# Patient Record
Sex: Male | Born: 1945
Health system: Southern US, Community
[De-identification: ages and names within clinical notes are randomized; demographics above are authoritative.]

## PROBLEM LIST (undated history)

## (undated) DIAGNOSIS — M1990 Unspecified osteoarthritis, unspecified site: Secondary | ICD-10-CM

## (undated) DIAGNOSIS — I10 Essential (primary) hypertension: Secondary | ICD-10-CM

## (undated) DIAGNOSIS — Z8619 Personal history of other infectious and parasitic diseases: Secondary | ICD-10-CM

## (undated) DIAGNOSIS — E785 Hyperlipidemia, unspecified: Secondary | ICD-10-CM

## (undated) HISTORY — DX: Hyperlipidemia, unspecified: E78.5

## (undated) HISTORY — DX: Personal history of other infectious and parasitic diseases: Z86.19

## (undated) HISTORY — DX: Essential (primary) hypertension: I10

---

## 1996-10-10 DIAGNOSIS — H9319 Tinnitus, unspecified ear: Secondary | ICD-10-CM | POA: Insufficient documentation

## 2001-10-10 HISTORY — PX: EYE SURGERY: SHX253

## 2003-10-11 HISTORY — PX: HERNIA REPAIR: SHX51

## 2012-01-05 DIAGNOSIS — Z86010 Personal history of colonic polyps: Secondary | ICD-10-CM | POA: Diagnosis not present

## 2012-01-05 DIAGNOSIS — K229 Disease of esophagus, unspecified: Secondary | ICD-10-CM | POA: Diagnosis not present

## 2012-01-05 DIAGNOSIS — K219 Gastro-esophageal reflux disease without esophagitis: Secondary | ICD-10-CM | POA: Diagnosis not present

## 2012-01-10 DIAGNOSIS — D126 Benign neoplasm of colon, unspecified: Secondary | ICD-10-CM | POA: Diagnosis not present

## 2012-01-10 DIAGNOSIS — K222 Esophageal obstruction: Secondary | ICD-10-CM | POA: Diagnosis not present

## 2012-01-10 DIAGNOSIS — Z86010 Personal history of colonic polyps: Secondary | ICD-10-CM | POA: Diagnosis not present

## 2012-01-10 DIAGNOSIS — K319 Disease of stomach and duodenum, unspecified: Secondary | ICD-10-CM | POA: Diagnosis not present

## 2012-01-10 DIAGNOSIS — R1310 Dysphagia, unspecified: Secondary | ICD-10-CM | POA: Diagnosis not present

## 2012-01-10 DIAGNOSIS — R12 Heartburn: Secondary | ICD-10-CM | POA: Diagnosis not present

## 2012-01-10 DIAGNOSIS — K2990 Gastroduodenitis, unspecified, without bleeding: Secondary | ICD-10-CM | POA: Diagnosis not present

## 2012-01-10 DIAGNOSIS — K2970 Gastritis, unspecified, without bleeding: Secondary | ICD-10-CM | POA: Diagnosis not present

## 2012-01-10 LAB — HM COLONOSCOPY

## 2012-02-12 ENCOUNTER — Emergency Department: Payer: Self-pay | Admitting: Emergency Medicine

## 2012-02-12 DIAGNOSIS — F17200 Nicotine dependence, unspecified, uncomplicated: Secondary | ICD-10-CM | POA: Diagnosis not present

## 2012-02-12 DIAGNOSIS — J9819 Other pulmonary collapse: Secondary | ICD-10-CM | POA: Diagnosis not present

## 2012-02-12 DIAGNOSIS — R000 Tachycardia, unspecified: Secondary | ICD-10-CM | POA: Diagnosis not present

## 2012-02-12 DIAGNOSIS — Z79899 Other long term (current) drug therapy: Secondary | ICD-10-CM | POA: Diagnosis not present

## 2012-02-12 DIAGNOSIS — R071 Chest pain on breathing: Secondary | ICD-10-CM | POA: Diagnosis not present

## 2012-02-12 DIAGNOSIS — R079 Chest pain, unspecified: Secondary | ICD-10-CM | POA: Diagnosis not present

## 2012-02-12 LAB — CK TOTAL AND CKMB (NOT AT ARMC)
CK, Total: 47 U/L (ref 35–232)
CK-MB: <0.5 ng/mL — ABNORMAL LOW (ref 0.5–3.6)

## 2012-02-12 LAB — CBC WITH DIFFERENTIAL/PLATELET
Basophil #: 0 10*3/uL (ref 0.0–0.1)
Basophil %: 0.5 %
Eosinophil %: 2.6 %
HCT: 41.3 % (ref 40.0–52.0)
HGB: 14.3 g/dL (ref 13.0–18.0)
Lymphocyte #: 1.6 10*3/uL (ref 1.0–3.6)
Lymphocyte %: 19.4 %
MCH: 33.2 pg (ref 26.0–34.0)
MCV: 96 fL (ref 80–100)
Monocyte #: 0.7 x10 3/mm (ref 0.2–1.0)
Neutrophil #: 5.8 10*3/uL (ref 1.4–6.5)
Neutrophil %: 68.9 %
Platelet: 172 10*3/uL (ref 150–440)
WBC: 8.4 10*3/uL (ref 3.8–10.6)

## 2012-02-12 LAB — COMPREHENSIVE METABOLIC PANEL
Alkaline Phosphatase: 58 U/L (ref 50–136)
Anion Gap: 4 — ABNORMAL LOW (ref 7–16)
BUN: 15 mg/dL (ref 7–18)
Calcium, Total: 8.8 mg/dL (ref 8.5–10.1)
Creatinine: 1.08 mg/dL (ref 0.60–1.30)
EGFR (Non-African Amer.): >60
Osmolality: 269 (ref 275–301)
Potassium: 3.6 mmol/L (ref 3.5–5.1)
SGPT (ALT): 23 U/L
Sodium: 134 mmol/L — ABNORMAL LOW (ref 136–145)
Total Protein: 7.3 g/dL (ref 6.4–8.2)

## 2012-02-12 LAB — TROPONIN I: Troponin-I: <0.02 ng/mL

## 2012-02-17 DIAGNOSIS — M940 Chondrocostal junction syndrome [Tietze]: Secondary | ICD-10-CM | POA: Diagnosis not present

## 2012-10-25 DIAGNOSIS — H2510 Age-related nuclear cataract, unspecified eye: Secondary | ICD-10-CM | POA: Diagnosis not present

## 2012-10-25 DIAGNOSIS — D3130 Benign neoplasm of unspecified choroid: Secondary | ICD-10-CM | POA: Diagnosis not present

## 2012-10-25 DIAGNOSIS — H524 Presbyopia: Secondary | ICD-10-CM | POA: Diagnosis not present

## 2013-07-16 DIAGNOSIS — Z23 Encounter for immunization: Secondary | ICD-10-CM | POA: Diagnosis not present

## 2013-07-16 DIAGNOSIS — R109 Unspecified abdominal pain: Secondary | ICD-10-CM | POA: Diagnosis not present

## 2013-07-16 DIAGNOSIS — K219 Gastro-esophageal reflux disease without esophagitis: Secondary | ICD-10-CM | POA: Diagnosis not present

## 2015-02-10 DIAGNOSIS — I10 Essential (primary) hypertension: Secondary | ICD-10-CM | POA: Diagnosis not present

## 2015-02-10 DIAGNOSIS — M778 Other enthesopathies, not elsewhere classified: Secondary | ICD-10-CM | POA: Diagnosis not present

## 2015-02-18 DIAGNOSIS — M1812 Unilateral primary osteoarthritis of first carpometacarpal joint, left hand: Secondary | ICD-10-CM | POA: Diagnosis not present

## 2015-06-03 ENCOUNTER — Ambulatory Visit: Payer: Self-pay | Admitting: Gastroenterology

## 2015-06-24 DIAGNOSIS — K259 Gastric ulcer, unspecified as acute or chronic, without hemorrhage or perforation: Secondary | ICD-10-CM

## 2015-06-24 DIAGNOSIS — R1030 Lower abdominal pain, unspecified: Secondary | ICD-10-CM | POA: Insufficient documentation

## 2015-06-24 DIAGNOSIS — K219 Gastro-esophageal reflux disease without esophagitis: Secondary | ICD-10-CM | POA: Insufficient documentation

## 2015-06-24 DIAGNOSIS — E785 Hyperlipidemia, unspecified: Secondary | ICD-10-CM | POA: Insufficient documentation

## 2015-06-24 DIAGNOSIS — Z8619 Personal history of other infectious and parasitic diseases: Secondary | ICD-10-CM | POA: Insufficient documentation

## 2015-06-24 DIAGNOSIS — Z86010 Personal history of colonic polyps: Secondary | ICD-10-CM | POA: Insufficient documentation

## 2015-06-24 DIAGNOSIS — I10 Essential (primary) hypertension: Secondary | ICD-10-CM | POA: Insufficient documentation

## 2015-06-24 DIAGNOSIS — G43909 Migraine, unspecified, not intractable, without status migrainosus: Secondary | ICD-10-CM | POA: Insufficient documentation

## 2015-06-24 DIAGNOSIS — M659 Synovitis and tenosynovitis, unspecified: Secondary | ICD-10-CM | POA: Insufficient documentation

## 2015-06-24 DIAGNOSIS — M940 Chondrocostal junction syndrome [Tietze]: Secondary | ICD-10-CM | POA: Insufficient documentation

## 2015-06-24 DIAGNOSIS — Z860101 Personal history of adenomatous and serrated colon polyps: Secondary | ICD-10-CM | POA: Insufficient documentation

## 2015-06-24 HISTORY — DX: Personal history of other infectious and parasitic diseases: Z86.19

## 2015-06-24 HISTORY — DX: Migraine, unspecified, not intractable, without status migrainosus: G43.909

## 2015-06-24 HISTORY — DX: Gastric ulcer, unspecified as acute or chronic, without hemorrhage or perforation: K25.9

## 2015-06-26 ENCOUNTER — Ambulatory Visit (INDEPENDENT_AMBULATORY_CARE_PROVIDER_SITE_OTHER): Payer: Medicare Other | Admitting: Family Medicine

## 2015-06-26 ENCOUNTER — Encounter: Payer: Self-pay | Admitting: Family Medicine

## 2015-06-26 VITALS — BP 120/80 | HR 75 | Temp 98.0°F | Resp 16 | Ht 65.0 in | Wt 130.0 lb

## 2015-06-26 DIAGNOSIS — Z23 Encounter for immunization: Secondary | ICD-10-CM | POA: Diagnosis not present

## 2015-06-26 DIAGNOSIS — F17210 Nicotine dependence, cigarettes, uncomplicated: Secondary | ICD-10-CM | POA: Insufficient documentation

## 2015-06-26 DIAGNOSIS — Z0000 Encounter for general adult medical examination without abnormal findings: Secondary | ICD-10-CM

## 2015-06-26 DIAGNOSIS — Z720 Tobacco use: Secondary | ICD-10-CM | POA: Diagnosis not present

## 2015-06-26 DIAGNOSIS — I10 Essential (primary) hypertension: Secondary | ICD-10-CM | POA: Diagnosis not present

## 2015-06-26 NOTE — Progress Notes (Signed)
Patient: Brad Myers, Male    DOB: 03/17/1946, 69 y.o.   MRN: [id number] Visit Date: 06/26/2015  Today's Provider: Lelon Huh, MD   Chief Complaint  Patient presents with  . Medicare Wellness  . Hypertension  . Gastrophageal Reflux  . Hyperlipidemia   Subjective:    Annual wellness visit Brad Myers is a 69 y.o. male. He feels well. He reports exercising yes/walking. He reports he is sleeping well.  -----------------------------------------------------------  Follow-up for GERD from 07/16/2013; started omeprazole 40 mg qd.    Hypertension, follow-up:  BP Readings from Last 3 Encounters:  06/26/15 120/80  02/10/15 126/66    He was last seen for hypertension 4 months ago.  BP at that visit was 126-66. Management since that visit includes none .He reports good compliance with treatment. He is not having side effects. none  He is not exercising. He is not adherent to low salt diet.   Outside blood pressures are n/a. He is experiencing none.  Patient denies none.   Cardiovascular risk factors include none.  Use of agents associated with hypertension: none.   ----------------------------------------------------------------------    Lipid/Cholesterol, Follow-up:   Last seen for this 12/16/2011/followed by [facility]. Management since that visit includes none.  Last Lipid Panel: No results found for: CHOL, TRIG, HDL, CHOLHDL, VLDL, LDLCALC, LDLDIRECT  He reports good compliance with treatment. He is not having side effects. none  Wt Readings from Last 3 Encounters:  06/26/15 130 lb (58.968 kg)  02/10/15 134 lb (60.782 kg)    ------------------------------------------------------------------------ Continues to follow up at [facility] every 3 months. Has labs done there.   Review of Systems  Constitutional: Negative.  Negative for fever, chills, appetite change and fatigue.  HENT: Negative.  Negative for congestion, ear pain, hearing loss, nosebleeds and trouble  swallowing.   Eyes: Negative.  Negative for pain and visual disturbance.  Respiratory: Negative.  Negative for cough, chest tightness and shortness of breath.   Cardiovascular: Negative.  Negative for chest pain, palpitations and leg swelling.  Gastrointestinal: Negative.  Negative for nausea, vomiting, abdominal pain, diarrhea, constipation and blood in stool.  Endocrine: Negative.  Negative for polydipsia, polyphagia and polyuria.  Genitourinary: Negative.  Negative for dysuria and flank pain.  Musculoskeletal: Negative.  Negative for myalgias, back pain, joint swelling, arthralgias and neck stiffness.  Skin: Negative.  Negative for color change, rash and wound.  Allergic/Immunologic: Negative.   Neurological: Negative.  Negative for dizziness, tremors, seizures, speech difficulty, weakness, light-headedness and headaches.  Hematological: Negative.   Psychiatric/Behavioral: Negative.  Negative for behavioral problems, confusion, sleep disturbance, dysphoric mood and decreased concentration. The patient is not nervous/anxious.   All other systems reviewed and are negative.   Social History   Social History  . Marital Status: Divorced    Spouse Name: N/A  . Number of Children: N/A  . Years of Education: N/A   Occupational History  . Not on file.   Social History Main Topics  . Smoking status: Former Research scientist (life sciences)  . Smokeless tobacco: Never Used  . Alcohol Use: Yes     Comment: Occasional use  . Drug Use: No  . Sexual Activity: Not on file   Other Topics Concern  . Not on file   Social History Narrative    Patient Active Problem List   Diagnosis Date Noted  . Tobacco abuse 06/26/2015  . Colon polyps 06/24/2015  . Essential (primary) hypertension 06/24/2015  . GERD (gastroesophageal reflux disease) 06/24/2015  .  HLD (hyperlipidemia) 06/24/2015  . Headache, migraine 06/24/2015  . Stomach ulcer 06/24/2015  . Tenosynovitis of hand 06/24/2015    Past Surgical History    Procedure Laterality Date  . Eye surgery Right 2003  . Hernia repair Right 2005    His family history is not on file.    Previous Medications   ASPIRIN 81 MG EC TABLET    Take 1 tablet by mouth daily.   NAPROXEN PO    Take by mouth at bedtime. One oral   OMEPRAZOLE (PRILOSEC) 40 MG CAPSULE    Take 1 capsule by mouth daily.   TRAZODONE HCL PO    Take by mouth at bedtime. 1 oral. Specific dose unknown   TRIAMTERENE (DYRENIUM) 50 MG CAPSULE    Take 1 capsule by mouth daily.    Patient Care Team: [name], MD as PCP - General (Family Medicine)     Objective:   Vitals: BP 120/80 mmHg  Pulse 75  Temp(Src) 98 F (36.7 C) (Oral)  Resp 16  Ht 5\' 5"  (1.651 m)  Wt 130 lb (58.968 kg)  BMI 21.63 kg/m2  SpO2 98%  Physical Exam   General Appearance:    Alert, cooperative, no distress, appears stated age  Head:    Normocephalic, without obvious abnormality, atraumatic  Eyes:    PERRL, conjunctiva/corneas clear, EOM's intact, fundi    benign, both eyes       Ears:    Normal TM's and external ear canals, both ears  Nose:   Nares normal, septum midline, mucosa normal, no drainage   or sinus tenderness  Throat:   Lips, mucosa, and tongue normal; teeth and gums normal  Neck:   Supple, symmetrical, trachea midline, no adenopathy;       thyroid:  No enlargement/tenderness/nodules; no carotid   bruit or JVD  Back:     Symmetric, no curvature, ROM normal, no CVA tenderness  Lungs:     Clear to auscultation bilaterally, respirations unlabored  Chest wall:    No tenderness or deformity  Heart:    Regular rate and rhythm, S1 and S2 normal, no murmur, rub   or gallop  Abdomen:     Soft, non-tender, bowel sounds active all four quadrants,    no masses, no organomegaly  Genitalia:    deferred  Rectal:    deferred  Extremities:   Extremities normal, atraumatic, no cyanosis or edema  Pulses:   2+ and symmetric all extremities  Skin:   Skin color, texture, turgor normal, no rashes or  lesions  Lymph nodes:   Cervical, supraclavicular, and axillary nodes normal  Neurologic:   CNII-XII intact. Normal strength, sensation and reflexes      throughout    Activities of Daily Living In your present state of health, do you have any difficulty performing the following activities: 06/26/2015  Hearing? Y  Vision? Y  Difficulty concentrating or making decisions? N  Walking or climbing stairs? N  Dressing or bathing? N  Doing errands, shopping? N    Fall Risk Assessment Fall Risk  06/26/2015  Falls in the past year? No     Depression Screen PHQ 2/9 Scores 06/26/2015  PHQ - 2 Score 0   Audit-C Alcohol Use Screening  Question Answer Points  How often do you have alcoholic drink? 2-4 times monthly 2  How many drinks do you typically consume in a day? 3 or 4 1  How oftey will you drink 6 or more in a  total? less than once a month 1  Total Score:  4   A score of 3 or more in women, and 4 or more in men indicates increased risk for alcohol abuse, EXCEPT if all of the points are from question 1.       Assessment & Plan:     Annual Wellness Visit  Reviewed patient's Family Medical History Reviewed and updated list of patient's medical providers Assessment of cognitive impairment was done Assessed patient's functional ability Established a written schedule for health screening [place] Completed and Reviewed  Exercise Activities and Dietary recommendations Goals    None      Immunization History  Administered Date(s) Administered  . Pneumococcal Conjugate-13 07/18/2014  . Pneumococcal Polysaccharide-23 01/23/2012  . Tdap 01/23/2012  . Zoster 05/18/2012    Health Maintenance  Topic Date Due  . Hepatitis C Screening  [birth year]  . INFLUENZA VACCINE  05/11/2015  . COLONOSCOPY  01/09/2017  . TETANUS/TDAP  01/22/2022  . ZOSTAVAX  Completed  . PNA vac Low Risk Adult  Completed      Discussed health benefits of physical activity,  and encouraged him to engage in regular exercise appropriate for his age and condition.    ------------------------------------------------------------------------------------------------------------  1. Medicare annual wellness visit, subsequent Doing well.   2. Essential (primary) hypertension Labs and meds ordered through [place].  - EKG 12-Lead  3. Need for influenza vaccination  - Flu vaccine HIGH DOSE PF  4. Tobacco abuse 1/2 - 1 ppd since he was in the [employer] nearly 50 years ago.  - CT CHEST LUNG CA SCREEN LOW DOSE W/O CM; Future

## 2015-07-09 ENCOUNTER — Other Ambulatory Visit: Payer: Self-pay | Admitting: Family Medicine

## 2015-07-09 ENCOUNTER — Encounter: Payer: Self-pay | Admitting: Family Medicine

## 2015-07-09 DIAGNOSIS — Z87891 Personal history of nicotine dependence: Secondary | ICD-10-CM | POA: Insufficient documentation

## 2015-07-10 ENCOUNTER — Ambulatory Visit: Payer: Medicare Other

## 2015-07-10 ENCOUNTER — Inpatient Hospital Stay: Payer: Medicare Other | Attending: Family Medicine | Admitting: Family Medicine

## 2015-07-10 DIAGNOSIS — Z87891 Personal history of nicotine dependence: Secondary | ICD-10-CM

## 2015-07-16 ENCOUNTER — Other Ambulatory Visit: Payer: Self-pay | Admitting: Family Medicine

## 2015-07-16 DIAGNOSIS — Z87891 Personal history of nicotine dependence: Secondary | ICD-10-CM

## 2015-07-17 ENCOUNTER — Inpatient Hospital Stay: Payer: Medicare Other | Attending: Family Medicine | Admitting: Family Medicine

## 2015-07-17 ENCOUNTER — Ambulatory Visit
Admission: RE | Admit: 2015-07-17 | Discharge: 2015-07-17 | Disposition: A | Discharge status: Home / Self Care | Payer: Medicare Other | Source: Ambulatory Visit | Attending: Family Medicine | Admitting: Family Medicine

## 2015-07-17 DIAGNOSIS — I2510 Atherosclerotic heart disease of native coronary artery without angina pectoris: Secondary | ICD-10-CM | POA: Insufficient documentation

## 2015-07-17 DIAGNOSIS — F17210 Nicotine dependence, cigarettes, uncomplicated: Secondary | ICD-10-CM

## 2015-07-17 DIAGNOSIS — Z87891 Personal history of nicotine dependence: Secondary | ICD-10-CM

## 2015-07-17 DIAGNOSIS — Z122 Encounter for screening for malignant neoplasm of respiratory organs: Secondary | ICD-10-CM | POA: Diagnosis not present

## 2015-07-17 NOTE — Progress Notes (Signed)
In accordance with CMS guidelines, patient has meet eligibility criteria including age, absence of signs or symptoms of lung cancer, the specific calculation of cigarette smoking pack-years was 30 years and is a current smoker.   A shared decision-making session was conducted prior to the performance of CT scan. This includes one or more decision aids, includes benefits and harms of screening, follow-up diagnostic testing, over-diagnosis, false positive rate, and total radiation exposure.  Counseling on the importance of adherence to annual lung cancer LDCT screening, impact of co-morbidities, and ability or willingness to undergo diagnosis and treatment is imperative for compliance of the program.  Counseling on the importance of continued smoking cessation for former smokers; the importance of smoking cessation for current smokers and information about tobacco cessation interventions have been given to patient including the [place] at [facility], 1800 quit , as well as Cancer Center specific smoking cessation programs.  Written order for lung cancer screening with LDCT has been given to the patient and any and all questions have been answered to the best of my abilities.   Yearly follow up will be scheduled by Shawn Perkins, Thoracic Navigator.   

## 2015-07-20 ENCOUNTER — Telehealth: Payer: Self-pay | Admitting: *Deleted

## 2015-07-20 ENCOUNTER — Encounter: Payer: Self-pay | Admitting: Family Medicine

## 2015-07-20 DIAGNOSIS — I2510 Atherosclerotic heart disease of native coronary artery without angina pectoris: Secondary | ICD-10-CM | POA: Insufficient documentation

## 2015-07-20 NOTE — Telephone Encounter (Signed)
  Oncology Nurse Navigator Documentation    Navigator Encounter Type: Treatment;Screening (07/20/15 0900)                          Notified patient of LDCT lung cancer screening results of Lung Rads 2 finding with recommendation for 12 month follow up imaging. Also notified of incidental finding noted below. Patient verbalizes understanding.   IMPRESSION: 1. Lung-RADS Category 2, benign appearance or behavior. Continue annual screening with low-dose chest CT without contrast in 12 months. 2. Coronary artery calcification.

## 2015-08-07 NOTE — Progress Notes (Signed)
This encounter was created in error - please disregard.

## 2015-08-12 DIAGNOSIS — M1811 Unilateral primary osteoarthritis of first carpometacarpal joint, right hand: Secondary | ICD-10-CM | POA: Diagnosis not present

## 2016-03-14 DIAGNOSIS — M7541 Impingement syndrome of right shoulder: Secondary | ICD-10-CM | POA: Diagnosis not present

## 2016-03-14 DIAGNOSIS — M7502 Adhesive capsulitis of left shoulder: Secondary | ICD-10-CM | POA: Diagnosis not present

## 2016-04-25 ENCOUNTER — Other Ambulatory Visit: Payer: Self-pay | Admitting: Specialist

## 2016-04-25 DIAGNOSIS — M7542 Impingement syndrome of left shoulder: Secondary | ICD-10-CM

## 2016-05-06 ENCOUNTER — Ambulatory Visit
Admission: RE | Admit: 2016-05-06 | Discharge: 2016-05-06 | Disposition: A | Discharge status: Home / Self Care | Payer: Medicare Other | Source: Ambulatory Visit | Attending: Specialist | Admitting: Specialist

## 2016-05-06 DIAGNOSIS — M7582 Other shoulder lesions, left shoulder: Secondary | ICD-10-CM | POA: Insufficient documentation

## 2016-05-06 DIAGNOSIS — M19012 Primary osteoarthritis, left shoulder: Secondary | ICD-10-CM | POA: Diagnosis not present

## 2016-05-06 DIAGNOSIS — M7542 Impingement syndrome of left shoulder: Secondary | ICD-10-CM | POA: Diagnosis not present

## 2016-05-09 DIAGNOSIS — M7502 Adhesive capsulitis of left shoulder: Secondary | ICD-10-CM | POA: Diagnosis not present

## 2016-07-12 ENCOUNTER — Telehealth: Payer: Self-pay | Admitting: *Deleted

## 2016-07-12 DIAGNOSIS — L570 Actinic keratosis: Secondary | ICD-10-CM | POA: Diagnosis not present

## 2016-07-12 DIAGNOSIS — L821 Other seborrheic keratosis: Secondary | ICD-10-CM | POA: Diagnosis not present

## 2016-07-12 DIAGNOSIS — D225 Melanocytic nevi of trunk: Secondary | ICD-10-CM | POA: Diagnosis not present

## 2016-07-12 DIAGNOSIS — X32XXXA Exposure to sunlight, initial encounter: Secondary | ICD-10-CM | POA: Diagnosis not present

## 2016-07-12 DIAGNOSIS — D2261 Melanocytic nevi of right upper limb, including shoulder: Secondary | ICD-10-CM | POA: Diagnosis not present

## 2016-07-12 DIAGNOSIS — D2262 Melanocytic nevi of left upper limb, including shoulder: Secondary | ICD-10-CM | POA: Diagnosis not present

## 2016-07-12 NOTE — Telephone Encounter (Signed)
Left voicemail for patient notifyng them that it is time to schedule annual low dose lung cancer screening CT scan. Instructed patient to call back to verify information prior to the scan being scheduled.  

## 2016-07-13 ENCOUNTER — Telehealth: Payer: Self-pay | Admitting: *Deleted

## 2016-07-13 NOTE — Telephone Encounter (Signed)
Patient returned call. Notified patient that annual lung cancer screening low dose CT scan is due. Confirmed that patient is within the age range of 55-77, and asymptomatic, (no signs or symptoms of lung cancer). Patient denies illness that would prevent curative treatment for lung cancer if found. The patient is a current smoker, with a 30.5 pack year history. The shared decision making visit was done 07/17/15. Patient is agreeable for CT scan being scheduled.

## 2016-08-04 ENCOUNTER — Other Ambulatory Visit: Payer: Self-pay | Admitting: *Deleted

## 2016-08-04 DIAGNOSIS — Z87891 Personal history of nicotine dependence: Secondary | ICD-10-CM

## 2016-08-12 ENCOUNTER — Ambulatory Visit
Admission: RE | Admit: 2016-08-12 | Discharge: 2016-08-12 | Disposition: A | Discharge status: Home / Self Care | Payer: Medicare Other | Source: Ambulatory Visit | Attending: Oncology | Admitting: Oncology

## 2016-08-12 DIAGNOSIS — Z87891 Personal history of nicotine dependence: Secondary | ICD-10-CM | POA: Insufficient documentation

## 2016-08-12 DIAGNOSIS — I700 Atherosclerosis of aorta: Secondary | ICD-10-CM | POA: Diagnosis not present

## 2016-08-12 DIAGNOSIS — J439 Emphysema, unspecified: Secondary | ICD-10-CM | POA: Diagnosis not present

## 2016-08-12 DIAGNOSIS — I2510 Atherosclerotic heart disease of native coronary artery without angina pectoris: Secondary | ICD-10-CM | POA: Insufficient documentation

## 2016-08-17 ENCOUNTER — Telehealth: Payer: Self-pay | Admitting: *Deleted

## 2016-08-17 NOTE — Telephone Encounter (Signed)
Notified patient of LDCT lung cancer screening results with recommendation for 6 month follow up imaging. Also notified of incidental finding noted below. Patient verbalizes understanding. I will coordinate 6 month follow up imaging. Encouraged patient to discuss incidental findings with PCP, who will receive this note forwarded via Epic.   IMPRESSION: 1. Lung-Rads category 3S, probably benign findings. Short-term follow-up in 6 months is recommended with repeat low-dose chest CT without contrast (please use the following order, "CT CHEST LCS NODULE FOLLOW-UP W/O CM"). 2. The "S" modifier above refers to potentially clinically significant non lung cancer related findings. Specifically, left main and two-vessel coronary atherosclerosis. 3. Additional findings include aortic atherosclerosis and mild emphysema.

## 2016-09-29 ENCOUNTER — Telehealth: Payer: Self-pay | Admitting: Family Medicine

## 2016-10-18 ENCOUNTER — Ambulatory Visit: Payer: Medicare Other | Admitting: Family Medicine

## 2016-10-18 ENCOUNTER — Ambulatory Visit: Payer: Medicare Other

## 2016-11-29 ENCOUNTER — Ambulatory Visit (INDEPENDENT_AMBULATORY_CARE_PROVIDER_SITE_OTHER): Payer: Medicare Other

## 2016-11-29 ENCOUNTER — Ambulatory Visit (INDEPENDENT_AMBULATORY_CARE_PROVIDER_SITE_OTHER): Payer: Medicare Other | Admitting: Family Medicine

## 2016-11-29 ENCOUNTER — Encounter: Payer: Self-pay | Admitting: Family Medicine

## 2016-11-29 VITALS — BP 138/74 | HR 80 | Temp 98.2°F | Ht 65.0 in | Wt 132.0 lb

## 2016-11-29 DIAGNOSIS — K219 Gastro-esophageal reflux disease without esophagitis: Secondary | ICD-10-CM | POA: Diagnosis not present

## 2016-11-29 DIAGNOSIS — I10 Essential (primary) hypertension: Secondary | ICD-10-CM

## 2016-11-29 DIAGNOSIS — E785 Hyperlipidemia, unspecified: Secondary | ICD-10-CM

## 2016-11-29 DIAGNOSIS — Z0000 Encounter for general adult medical examination without abnormal findings: Secondary | ICD-10-CM | POA: Diagnosis not present

## 2016-11-29 DIAGNOSIS — I2510 Atherosclerotic heart disease of native coronary artery without angina pectoris: Secondary | ICD-10-CM

## 2016-11-29 NOTE — Patient Instructions (Signed)
   Call back for referral for colonoscopy before the end of the year.

## 2016-11-29 NOTE — Progress Notes (Signed)
Patient: Brad Myers Male    DOB: July 12, 1946   71 y.o.   MRN: [id number] Visit Date: 11/29/2016  Today's Provider: Lelon Huh, MD   Chief Complaint  Patient presents with  . Hyperlipidemia    follow up  . Hypertension    follow up   Subjective:    HPI  Hypertension, follow-up:  BP Readings from Last 3 Encounters:  11/29/16 138/74  06/26/15 120/80  02/10/15 126/66    He was last seen for hypertension 2 years ago.  BP at that visit was 120/80. Management since that visit includes no changes. He reports good compliance with treatment. He is not having side effects.  He is exercising. He is adherent to low salt diet.   Outside blood pressures are normal per patient. He is experiencing none.  Patient denies chest pain, chest pressure/discomfort, claudication, dyspnea, exertional chest pressure/discomfort, fatigue, irregular heart beat, lower extremity edema, near-syncope, orthopnea, palpitations, paroxysmal nocturnal dyspnea, syncope and tachypnea.   Cardiovascular risk factors include advanced age (older than 38 for men, 75 for women), dyslipidemia, hypertension and male gender.  Use of agents associated with hypertension: NSAIDS.     Weight trend: stable Wt Readings from Last 3 Encounters:  11/29/16 132 lb (59.9 kg)  08/12/16 125 lb (56.7 kg)  07/17/15 130 lb (59 kg)    Current diet: well balanced  ------------------------------------------------------------------------  Lipid/Cholesterol, Follow-up:   Last seen for this by the [place].  Marland Kitchen Last Lipid Panel: No results found for: CHOL, TRIG, HDL, CHOLHDL, VLDL, LDLCALC, LDLDIRECT  Risk factors for vascular disease include hypercholesterolemia and hypertension  He reports good compliance with treatment. He is not having side effects.  Current symptoms include none and have been stable. Weight trend: stable Prior visit with dietician: no Current diet: well balanced Current exercise: golfing,  walking  Wt Readings from Last 3 Encounters:  11/29/16 132 lb (59.9 kg)  08/12/16 125 lb (56.7 kg)  07/17/15 130 lb (59 kg)    -------------------------------------------------------------------     No Known Allergies   Current Outpatient Prescriptions:  .  aspirin 81 MG EC tablet, Take 1 tablet by mouth daily., Disp: , Rfl:  .  meloxicam (MOBIC) 15 MG tablet, Take 15 mg by mouth daily., Disp: , Rfl:  .  methocarbamol (ROBAXIN) 500 MG tablet, Take 500 mg by mouth., Disp: , Rfl:  .  Multiple Vitamin (MULTIVITAMIN) capsule, Take 1 capsule by mouth daily., Disp: , Rfl:  .  NAPROXEN PO, Take by mouth at bedtime. One oral, Disp: , Rfl:  .  omeprazole (PRILOSEC) 40 MG capsule, Take 1 capsule by mouth daily., Disp: , Rfl:  .  simvastatin (ZOCOR) 80 MG tablet, Take 80 mg by mouth daily., Disp: , Rfl:  .  TRAZODONE HCL PO, Take by mouth at bedtime. 1 oral. Specific dose unknown, Disp: , Rfl:  .  triamterene (DYRENIUM) 50 MG capsule, Take 1 capsule by mouth daily., Disp: , Rfl:  .  triamterene-hydrochlorothiazide (DYAZIDE) 37.5-25 MG capsule, Take 1 capsule by mouth daily., Disp: , Rfl:   Review of Systems  Constitutional: Negative for appetite change, chills and fever.  Respiratory: Negative for chest tightness, shortness of breath and wheezing.   Cardiovascular: Negative for chest pain and palpitations.  Gastrointestinal: Negative for abdominal pain, nausea and vomiting.    Social History  Substance Use Topics  . Smoking status: Current Every Day Smoker    Packs/day: 0.50    Years: 50.00  Types: Cigarettes  . Smokeless tobacco: Former Systems developer    Types: Snuff, Chew  . Alcohol use 14.4 oz/week    24 Cans of beer per week   Objective:    Vital Signs - Last Recorded  Most recent update: 11/29/2016 1:08 PM by Fabio Neighbors, LPN  BP  [id number] (BP Location: Right Arm)     Pulse  80     Temp  98.2 F (36.8 C) (Oral)     Ht  5\' 5"  (1.651 m)     Wt  132 lb (59.9 kg)       BMI  21.97 kg/m       Physical Exam   General Appearance:    Alert, cooperative, no distress  Eyes:    PERRL, conjunctiva/corneas clear, EOM's intact       Lungs:     Clear to auscultation bilaterally, respirations unlabored  Heart:    Regular rate and rhythm  Neurologic:   Awake, alert, oriented x 3. No apparent focal neurological           defect.           Assessment & Plan:     1. Coronary artery calcification seen on CAT scan Asymptomatic. Compliant with medication.  Continue aggressive risk factor modification.    2. Essential (primary) hypertension Well controlled.  Continue current medications.   - Renal function panel  3. Gastroesophageal reflux disease, esophagitis presence not specified Well controlled on omeprazole.   4. Hyperlipidemia, unspecified hyperlipidemia type He is tolerating simvastatin well with no adverse effects.   - Lipid panel - Hepatic function panel       Lelon Huh, MD  [facility]

## 2016-11-29 NOTE — Progress Notes (Addendum)
Subjective:   Brad Myers is a 71 y.o. male who presents for Medicare Annual/Subsequent preventive examination.  Review of Systems:  N/A   Cardiac Risk Factors include: advanced age (>51men, >24 women);hypertension;dyslipidemia;male gender;smoking/ tobacco exposure     Objective:    Vitals: BP 138/74 (BP Location: Right Arm)   Pulse 80   Temp 98.2 F (36.8 C) (Oral)   Ht 5\' 5"  (1.651 m)   Wt 132 lb (59.9 kg)   BMI 21.97 kg/m   Body mass index is 21.97 kg/m.  Tobacco History  Smoking Status  . Current Every Day Smoker  . Packs/day: 0.50  . Years: 50.00  . Types: Cigarettes  Smokeless Tobacco  . Former Systems developer  . Types: Snuff, Chew     Ready to quit: No Counseling given: No   Past Medical History:  Diagnosis Date  . History of chicken pox 06/24/2015   DID have Chicken Pox. DID have Measles. DID have Mumps.    . Hyperlipidemia   . Hypertension   . Personal history of tobacco use, presenting hazards to health 07/09/2015   Past Surgical History:  Procedure Laterality Date  . EYE SURGERY Right 2003  . HERNIA REPAIR Right 2005   History reviewed. No pertinent family history. History  Sexual Activity  . Sexual activity: Not on file    Outpatient Encounter Prescriptions as of 11/29/2016  Medication Sig  . aspirin 81 MG EC tablet Take 1 tablet by mouth daily.  . meloxicam (MOBIC) 15 MG tablet Take 15 mg by mouth daily.  . methocarbamol (ROBAXIN) 500 MG tablet Take 500 mg by mouth.  . Multiple Vitamin (MULTIVITAMIN) capsule Take 1 capsule by mouth daily.  . simvastatin (ZOCOR) 80 MG tablet Take 80 mg by mouth daily.  . TRAZODONE HCL PO Take by mouth at bedtime. 1 oral. Specific dose unknown  . triamterene-hydrochlorothiazide (DYAZIDE) 37.5-25 MG capsule Take 1 capsule by mouth daily.  Marland Kitchen NAPROXEN PO Take by mouth at bedtime. One oral  . omeprazole (PRILOSEC) 40 MG capsule Take 1 capsule by mouth daily.  Marland Kitchen triamterene (DYRENIUM) 50 MG capsule Take 1 capsule by  mouth daily.   No facility-administered encounter medications on file as of 11/29/2016.     Activities of Daily Living In your present state of health, do you have any difficulty performing the following activities: 11/29/2016  Hearing? N  Vision? N  Difficulty concentrating or making decisions? N  Walking or climbing stairs? N  Dressing or bathing? N  Doing errands, shopping? N  Preparing Food and eating ? N  Using the Toilet? N  In the past six months, have you accidently leaked urine? N  Do you have problems with loss of bowel control? N  Managing your Medications? N  Managing your Finances? N  Housekeeping or managing your Housekeeping? N  Some recent data might be hidden    Patient Care Team: [name], MD as PCP - General (Family Medicine) Katherine Mantle, OD as Consulting Physician (Optometry)   Assessment:     Exercise Activities and Dietary recommendations Current Exercise Habits: Structured exercise class, Type of exercise: walking, Time (Minutes): 30, Frequency (Times/Week): 1, Weekly Exercise (Minutes/Week): 30, Intensity: Mild  Goals    . Exercise          Starting in Spring 2018, I will increase walking to at least 3 days a week for 30 minutes. I will also increase my exercise once joining the hiking exercise for seniors at [facility].      Fall Risk Fall Risk  11/29/2016 11/29/2016 06/26/2015  Falls in the past year? No No No   Depression Screen PHQ 2/9 Scores 11/29/2016 11/29/2016 06/26/2015  PHQ - 2 Score 0 0 0    Cognitive Function     6CIT Screen 11/29/2016  What Year? 0 points  What month? 0 points  What time? 0 points  Count back from 20 0 points  Months in reverse 0 points  Repeat phrase 0 points  Total Score 0    Immunization History  Administered Date(s) Administered  . Influenza, High Dose Seasonal PF 06/26/2015  . Pneumococcal Conjugate-13 07/18/2014  . Pneumococcal Polysaccharide-23 01/23/2012  . Tdap 01/23/2012  .  Zoster 05/18/2012   Screening Tests Health Maintenance  Topic Date Due  . COLONOSCOPY  01/09/2017  . TETANUS/TDAP  01/22/2022  . INFLUENZA VACCINE  Completed  . Hepatitis C Screening  Completed  . PNA vac Low Risk Adult  Completed      Plan:  I have personally reviewed and addressed the Medicare Annual Wellness questionnaire and have noted the following in the patient's chart:  A. Medical and social history B. Use of alcohol, tobacco or illicit drugs  C. Current medications and supplements D. Functional ability and status E.  Nutritional status F.  Physical activity G. Advance directives H. List of other physicians I.  Hospitalizations, surgeries, and ER visits in previous 12 months J.  [place] such as hearing and vision if needed, cognitive and depression L. Referrals and appointments - none  In addition, I have reviewed and discussed with patient certain preventive protocols, quality metrics, and best practice recommendations. A written personalized care plan for preventive services as well as general preventive health recommendations were provided to patient.  See attached scanned questionnaire for additional information.   Signed,  Fabio Neighbors, LPN Nurse Health Advisor   MD Recommendations: None.  I have reviewed the health advisor's note, was available for consultation, and agree with documentation and plan  Lelon Huh, MD

## 2016-11-29 NOTE — Patient Instructions (Signed)

## 2016-12-01 DIAGNOSIS — E785 Hyperlipidemia, unspecified: Secondary | ICD-10-CM | POA: Diagnosis not present

## 2016-12-01 DIAGNOSIS — I10 Essential (primary) hypertension: Secondary | ICD-10-CM | POA: Diagnosis not present

## 2016-12-02 ENCOUNTER — Telehealth: Payer: Self-pay

## 2016-12-02 LAB — LIPID PANEL
CHOL/HDL RATIO: 3.4 (ref 0.0–5.0)
Cholesterol, Total: 185 mg/dL (ref 100–199)
HDL: 54 mg/dL (ref >39)
LDL CALC: 85 (ref 0–99)
TRIGLYCERIDES: 228 mg/dL — AB (ref 0–149)
VLDL Cholesterol Cal: 46 — ABNORMAL HIGH (ref 5–40)

## 2016-12-02 LAB — RENAL FUNCTION PANEL
Albumin: 4.7 g/dL (ref 3.5–4.8)
BUN/Creatinine Ratio: 11 (ref 10–24)
BUN: 12 mg/dL (ref 8–27)
CO2: 23 mmol/L (ref 18–29)
CREATININE: 1.08 mg/dL (ref 0.76–1.27)
Calcium: 9.5 mg/dL (ref 8.6–10.2)
Chloride: 97 mmol/L (ref 96–106)
GFR, EST AFRICAN AMERICAN: 80 (ref >59)
GFR, EST NON AFRICAN AMERICAN: 69 (ref >59)
Glucose: 124 mg/dL — ABNORMAL HIGH (ref 65–99)
PHOSPHORUS: 3.6 mg/dL (ref 2.5–4.5)
POTASSIUM: 3.9 mmol/L (ref 3.5–5.2)
SODIUM: 139 mmol/L (ref 134–144)

## 2016-12-02 LAB — HEPATIC FUNCTION PANEL
ALK PHOS: 64 IU/L (ref 39–117)
ALT: 20 IU/L (ref 0–44)
AST: 19 IU/L (ref 0–40)
BILIRUBIN, DIRECT: 0.13 mg/dL (ref 0.00–0.40)
Bilirubin Total: 0.5 mg/dL (ref 0.0–1.2)
TOTAL PROTEIN: 7.2 g/dL (ref 6.0–8.5)

## 2016-12-02 NOTE — Telephone Encounter (Signed)
-----   Message from [name], MD sent at 12/02/2016  7:56 AM EST ----- LDL cholesterol needs to be under 70. Recommend change from simvastatin to atorvastatin 40mg  once a day, #30, rf x 3 and follow up in 3 months.

## 2016-12-02 NOTE — Telephone Encounter (Signed)
Left message to call back.      Notes Recorded by [name], MD on 12/02/2016 at 7:53 AM EST Blood sugar slightly high, but not in diabetic range. Avoid sweets and starchy foods. Cholesterol Liver and kidney functions are good. Continue current medications. Follow up September for yearly physical

## 2016-12-06 MED ORDER — ATORVASTATIN CALCIUM 40 MG PO TABS: 40.0000 mg | ORAL_TABLET | Freq: Every day | ORAL | 3 refills | Status: DC

## 2016-12-06 NOTE — Telephone Encounter (Signed)
Advised patient as below. Medication was sent into the pharmacy. Patient will call for an appt.

## 2017-01-10 DIAGNOSIS — L821 Other seborrheic keratosis: Secondary | ICD-10-CM | POA: Diagnosis not present

## 2017-01-10 DIAGNOSIS — X32XXXA Exposure to sunlight, initial encounter: Secondary | ICD-10-CM | POA: Diagnosis not present

## 2017-01-10 DIAGNOSIS — L218 Other seborrheic dermatitis: Secondary | ICD-10-CM | POA: Diagnosis not present

## 2017-01-10 DIAGNOSIS — L820 Inflamed seborrheic keratosis: Secondary | ICD-10-CM | POA: Diagnosis not present

## 2017-01-10 DIAGNOSIS — L530 Toxic erythema: Secondary | ICD-10-CM | POA: Diagnosis not present

## 2017-01-10 DIAGNOSIS — L298 Other pruritus: Secondary | ICD-10-CM | POA: Diagnosis not present

## 2017-01-10 DIAGNOSIS — L570 Actinic keratosis: Secondary | ICD-10-CM | POA: Diagnosis not present

## 2017-02-06 ENCOUNTER — Telehealth: Payer: Self-pay | Admitting: *Deleted

## 2017-02-06 ENCOUNTER — Telehealth: Payer: Self-pay

## 2017-02-06 DIAGNOSIS — Z87891 Personal history of nicotine dependence: Secondary | ICD-10-CM

## 2017-02-06 DIAGNOSIS — R9389 Abnormal findings on diagnostic imaging of other specified body structures: Secondary | ICD-10-CM

## 2017-02-06 NOTE — Telephone Encounter (Signed)
CT of Chest On 5/9 8:30 arrive @ [facility] if need to r/s

## 2017-02-06 NOTE — Telephone Encounter (Signed)
Notified patient that lung cancer screening low dose CT scan follow up imaging is due. Confirmed that patient is within the age range of 55-77, and asymptomatic, (no signs or symptoms of lung cancer). Patient denies illness that would prevent curative treatment for lung cancer if found. The patient is a current smoker, with a 31 pack year history. The shared decision making visit was done 07/17/15. Patient is agreeable for CT scan being scheduled.

## 2017-02-06 NOTE — Telephone Encounter (Signed)
Left voicemail for patient notifyng them that it is time to schedule annual low dose lung cancer screening CT scan. Instructed patient to call back to verify information prior to the scan being scheduled.  

## 2017-02-15 ENCOUNTER — Ambulatory Visit
Admission: RE | Admit: 2017-02-15 | Discharge: 2017-02-15 | Disposition: A | Discharge status: Home / Self Care | Payer: Medicare Other | Source: Ambulatory Visit | Attending: Oncology | Admitting: Oncology

## 2017-02-15 DIAGNOSIS — F17210 Nicotine dependence, cigarettes, uncomplicated: Secondary | ICD-10-CM | POA: Diagnosis not present

## 2017-02-15 DIAGNOSIS — I700 Atherosclerosis of aorta: Secondary | ICD-10-CM | POA: Diagnosis not present

## 2017-02-15 DIAGNOSIS — R938 Abnormal findings on diagnostic imaging of other specified body structures: Secondary | ICD-10-CM | POA: Diagnosis not present

## 2017-02-15 DIAGNOSIS — J438 Other emphysema: Secondary | ICD-10-CM | POA: Diagnosis not present

## 2017-02-15 DIAGNOSIS — I2510 Atherosclerotic heart disease of native coronary artery without angina pectoris: Secondary | ICD-10-CM | POA: Insufficient documentation

## 2017-02-15 DIAGNOSIS — R9389 Abnormal findings on diagnostic imaging of other specified body structures: Secondary | ICD-10-CM

## 2017-02-15 DIAGNOSIS — J439 Emphysema, unspecified: Secondary | ICD-10-CM | POA: Insufficient documentation

## 2017-02-15 DIAGNOSIS — Z87891 Personal history of nicotine dependence: Secondary | ICD-10-CM | POA: Diagnosis not present

## 2017-02-21 ENCOUNTER — Encounter: Payer: Self-pay | Admitting: *Deleted

## 2017-04-19 ENCOUNTER — Telehealth: Payer: Self-pay | Admitting: Family Medicine

## 2017-04-19 NOTE — Telephone Encounter (Signed)
Made appt

## 2017-04-21 ENCOUNTER — Encounter: Payer: Self-pay | Admitting: Family Medicine

## 2017-04-21 ENCOUNTER — Ambulatory Visit (INDEPENDENT_AMBULATORY_CARE_PROVIDER_SITE_OTHER): Payer: Medicare Other | Admitting: Family Medicine

## 2017-04-21 VITALS — BP 130/70 | HR 64 | Temp 97.7°F | Resp 16 | Wt 128.0 lb

## 2017-04-21 DIAGNOSIS — E785 Hyperlipidemia, unspecified: Secondary | ICD-10-CM | POA: Diagnosis not present

## 2017-04-21 DIAGNOSIS — I10 Essential (primary) hypertension: Secondary | ICD-10-CM

## 2017-04-21 DIAGNOSIS — I2510 Atherosclerotic heart disease of native coronary artery without angina pectoris: Secondary | ICD-10-CM

## 2017-04-21 DIAGNOSIS — G4700 Insomnia, unspecified: Secondary | ICD-10-CM

## 2017-04-21 DIAGNOSIS — R739 Hyperglycemia, unspecified: Secondary | ICD-10-CM | POA: Diagnosis not present

## 2017-04-21 NOTE — Progress Notes (Signed)
Patient: Brad Myers Male    DOB: 09-25-1946   71 y.o.   MRN: [id number] Visit Date: 04/21/2017  Today's Provider: Lelon Huh, MD   Chief Complaint  Patient presents with  . Hypertension    follow up  . Hyperlipidemia    follow up  . Insomnia    follow up   Subjective:    HPI  Hypertension, follow-up:  BP Readings from Last 3 Encounters:  11/29/16 138/74  06/26/15 120/80  02/10/15 126/66    He was last seen for hypertension 5 months ago.  BP at that visit was 138/74. Management since that visit includes no changes. He reports good compliance with treatment. He is not having side effects.  He is exercising. He is adherent to low salt diet.   Outside blood pressures are 140/80. He is experiencing none.  Patient denies chest pain, chest pressure/discomfort, claudication, dyspnea, exertional chest pressure/discomfort, fatigue, irregular heart beat, lower extremity edema, near-syncope, orthopnea, palpitations, paroxysmal nocturnal dyspnea, syncope and tachypnea.   Cardiovascular risk factors include advanced age (older than 74 for men, 23 for women), hypertension and male gender.  Use of agents associated with hypertension: NSAIDS.     Weight trend: stable Wt Readings from Last 3 Encounters:  11/29/16 132 lb (59.9 kg)  08/12/16 125 lb (56.7 kg)  07/17/15 130 lb (59 kg)    Current diet: in general, a "healthy" diet    ------------------------------------------------------------------------  Lipid/Cholesterol, Follow-up:   Last seen for this5 months ago.  Management changes since that visit include changing from Simvastatin to Atorvastatin. . Last Lipid Panel:    Component Value Date/Time   CHOL 185 12/01/2016 1019   TRIG 228 (H) 12/01/2016 1019   HDL 54 12/01/2016 1019   CHOLHDL 3.4 12/01/2016 1019   LDLCALC 85 12/01/2016 1019    Risk factors for vascular disease include hypercholesterolemia and hypertension  He reports good compliance with  treatment. He is not having side effects.  Current symptoms include none and have been stable. Weight trend: stable Prior visit with dietician: no Current diet: in general, a "healthy" diet   Current exercise: walking  Wt Readings from Last 3 Encounters:  11/29/16 132 lb (59.9 kg)  08/12/16 125 lb (56.7 kg)  07/17/15 130 lb (59 kg)    ------------------------------------------------------------------- Follow up of Insomnia:  Patient current\ly takes Trazodone 50. He reports good compliance with treatment, good tolerance and good symptom control.   Hyperglycemia. He was noted to have elevated fasting glucose of 124 with labs  In February. He does have family history of diabetes.     No Known Allergies   Current Outpatient Prescriptions:  .  aspirin 81 MG EC tablet, Take 1 tablet by mouth daily., Disp: , Rfl:  .  atorvastatin (LIPITOR) 40 MG tablet, Take 1 tablet (40 mg total) by mouth daily., Disp: 30 tablet, Rfl: 3 .  Multiple Vitamin (MULTIVITAMIN) capsule, Take 1 capsule by mouth daily., Disp: , Rfl:  .  NAPROXEN PO, Take by mouth at bedtime. One oral, Disp: , Rfl:  .  TRAZODONE HCL PO, Take 50 mg by mouth at bedtime. 1 oral. Specific dose unknown, Disp: , Rfl:  .  triamterene-hydrochlorothiazide (DYAZIDE) 37.5-25 MG capsule, Take 1 capsule by mouth daily., Disp: , Rfl:   Review of Systems  Constitutional: Negative for appetite change, chills and fever.  Respiratory: Negative for chest tightness, shortness of breath and wheezing.   Cardiovascular: Negative for chest pain and palpitations.  Gastrointestinal: Negative for abdominal pain, nausea and vomiting.    Social History  Substance Use Topics  . Smoking status: Current Every Day Smoker    Packs/day: 0.25    Years: 55.00    Types: Cigarettes  . Smokeless tobacco: Former Systems developer    Types: Snuff, Chew  . Alcohol use 14.4 oz/week    24 Cans of beer per week   Objective:   BP 130/70 (BP Location: Left Arm, Patient  Position: Sitting, Cuff Size: Normal)   Pulse 64   Temp 97.7 F (36.5 C) (Oral)   Resp 16   Wt 128 lb (58.1 kg)   SpO2 99%   BMI 21.30 kg/m     Physical Exam   General Appearance:    Alert, cooperative, no distress  Eyes:    PERRL, conjunctiva/corneas clear, EOM's intact       Lungs:     Clear to auscultation bilaterally, respirations unlabored  Heart:    Regular rate and rhythm  Neurologic:   Awake, alert, oriented x 3. No apparent focal neurological           defect.           Assessment & Plan:     1. Insomnia, unspecified type Doing well with trazodone 50mg  which he has been taking for many years prescribed from [place]. He anticipates starting to get his medications refilled from here.   2. Essential (primary) hypertension Well controlled. Continue current medications.   - Comprehensive metabolic panel  3. Hyperlipidemia, unspecified hyperlipidemia type He is tolerating change to atorvastatin well with no adverse effects.   - Lipid panel  4. Hyperglycemia  - Hemoglobin A1c       Lelon Huh, MD  [facility]

## 2017-04-22 LAB — COMPREHENSIVE METABOLIC PANEL
ALBUMIN: 4.4 g/dL (ref 3.5–4.8)
ALK PHOS: 71 IU/L (ref 39–117)
ALT: 16 IU/L (ref 0–44)
AST: 15 IU/L (ref 0–40)
Albumin/Globulin Ratio: 1.9 (ref 1.2–2.2)
BUN / CREAT RATIO: 10 (ref 10–24)
BUN: 9 mg/dL (ref 8–27)
Bilirubin Total: 0.4 mg/dL (ref 0.0–1.2)
CO2: 24 mmol/L (ref 20–29)
CREATININE: 0.9 mg/dL (ref 0.76–1.27)
Calcium: 9.4 mg/dL (ref 8.6–10.2)
Chloride: 103 mmol/L (ref 96–106)
GFR calc non Af Amer: 86 mL/min/{1.73_m2} (ref >59)
GFR, EST AFRICAN AMERICAN: 100 mL/min/{1.73_m2} (ref >59)
GLUCOSE: 93 mg/dL (ref 65–99)
Globulin, Total: 2.3 g/dL (ref 1.5–4.5)
Potassium: 4.3 mmol/L (ref 3.5–5.2)
Sodium: 140 mmol/L (ref 134–144)
TOTAL PROTEIN: 6.7 g/dL (ref 6.0–8.5)

## 2017-04-22 LAB — LIPID PANEL
CHOLESTEROL TOTAL: 138 mg/dL (ref 100–199)
Chol/HDL Ratio: 2.8 ratio (ref 0.0–5.0)
HDL: 50 mg/dL (ref >39)
LDL Calculated: 58 mg/dL (ref 0–99)
TRIGLYCERIDES: 148 mg/dL (ref 0–149)
VLDL Cholesterol Cal: 30 mg/dL (ref 5–40)

## 2017-04-22 LAB — HEMOGLOBIN A1C
Est. average glucose Bld gHb Est-mCnc: 100 mg/dL
Hgb A1c MFr Bld: 5.1 % (ref 4.8–5.6)

## 2017-12-11 ENCOUNTER — Ambulatory Visit (INDEPENDENT_AMBULATORY_CARE_PROVIDER_SITE_OTHER): Payer: Medicare Other

## 2017-12-11 VITALS — BP 124/82 | HR 72 | Temp 97.9°F | Ht 65.0 in | Wt 136.2 lb

## 2017-12-11 DIAGNOSIS — Z0000 Encounter for general adult medical examination without abnormal findings: Secondary | ICD-10-CM | POA: Diagnosis not present

## 2017-12-11 NOTE — Patient Instructions (Signed)
Brad Myers , Thank you for taking time to come for your Medicare Wellness Visit. I appreciate your ongoing commitment to your health goals. Please review the following plan we discussed and let me know if I can assist you in the future.   Screening recommendations/referrals: Colonoscopy: Pt declines today.  Recommended yearly ophthalmology/optometry visit for glaucoma screening and checkup Recommended yearly dental visit for hygiene and checkup  Vaccinations: Influenza vaccine: Up to date Pneumococcal vaccine: Up to date Tdap vaccine: Up to date Shingles vaccine: Pt declines today.     Advanced directives: Already on file.  Conditions/risks identified: Smoking cessation; recommend increasing water intake to 2-4 glasses a day (currently drinking none).  Next appointment: Declined set up of CPE, scheduled for AWV 12/13/18.  Preventive Care 72 Years and Older, Male Preventive care refers to lifestyle choices and visits with your health care provider that can promote health and wellness. What does preventive care include?  A yearly physical exam. This is also called an annual well check.  Dental exams once or twice a year.  Routine eye exams. Ask your health care provider how often you should have your eyes checked.  Personal lifestyle choices, including:  Daily care of your teeth and gums.  Regular physical activity.  Eating a healthy diet.  Avoiding tobacco and drug use.  Limiting alcohol use.  Practicing safe sex.  Taking low doses of aspirin every day.  Taking vitamin and mineral supplements as recommended by your health care provider. What happens during an annual well check? The services and screenings done by your health care provider during your annual well check will depend on your age, overall health, lifestyle risk factors, and family history of disease. Counseling  Your health care provider may ask you questions about your:  Alcohol use.  Tobacco use.  Drug  use.  Emotional well-being.  Home and relationship well-being.  Sexual activity.  Eating habits.  History of falls.  Memory and ability to understand (cognition).  Work and work Statistician. Screening  You may have the following tests or measurements:  Height, weight, and BMI.  Blood pressure.  Lipid and cholesterol levels. These may be checked every 5 years, or more frequently if you are over 83 years old.  Skin check.  Lung cancer screening. You may have this screening every year starting at age 6 if you have a 30-pack-year history of smoking and currently smoke or have quit within the past 15 years.  Fecal occult blood test (FOBT) of the stool. You may have this test every year starting at age 53.  Flexible sigmoidoscopy or colonoscopy. You may have a sigmoidoscopy every 5 years or a colonoscopy every 10 years starting at age 83.  Prostate cancer screening. Recommendations will vary depending on your family history and other risks.  Hepatitis C blood test.  Hepatitis B blood test.  Sexually transmitted disease (STD) testing.  Diabetes screening. This is done by checking your blood sugar (glucose) after you have not eaten for a while (fasting). You may have this done every 1-3 years.  Abdominal aortic aneurysm (AAA) screening. You may need this if you are a current or former smoker.  Osteoporosis. You may be screened starting at age 30 if you are at high risk. Talk with your health care provider about your test results, treatment options, and if necessary, the need for more tests. Vaccines  Your health care provider may recommend certain vaccines, such as:  Influenza vaccine. This is recommended every year.  Tetanus, diphtheria, and acellular pertussis (Tdap, Td) vaccine. You may need a Td booster every 10 years.  Zoster vaccine. You may need this after age 11.  Pneumococcal 13-valent conjugate (PCV13) vaccine. One dose is recommended after age  6.  Pneumococcal polysaccharide (PPSV23) vaccine. One dose is recommended after age 69. Talk to your health care provider about which screenings and vaccines you need and how often you need them. This information is not intended to replace advice given to you by your health care provider. Make sure you discuss any questions you have with your health care provider. Document Released: 10/23/2015 Document Revised: 06/15/2016 Document Reviewed: 07/28/2015 Elsevier Interactive Patient Education  2017 [place] Prevention in the Home Falls can cause injuries. They can happen to people of all ages. There are many things you can do to make your home safe and to help prevent falls. What can I do on the outside of my home?  Regularly fix the edges of walkways and driveways and fix any cracks.  Remove anything that might make you trip as you walk through a door, such as a raised step or threshold.  Trim any bushes or trees on the path to your home.  Use bright outdoor lighting.  Clear any walking paths of anything that might make someone trip, such as rocks or tools.  Regularly check to see if handrails are loose or broken. Make sure that both sides of any steps have handrails.  Any raised decks and porches should have guardrails on the edges.  Have any leaves, snow, or ice cleared regularly.  Use sand or salt on walking paths during winter.  Clean up any spills in your garage right away. This includes oil or grease spills. What can I do in the bathroom?  Use night lights.  Install grab bars by the toilet and in the tub and shower. Do not use towel bars as grab bars.  Use non-skid mats or decals in the tub or shower.  If you need to sit down in the shower, use a plastic, non-slip stool.  Keep the floor dry. Clean up any water that spills on the floor as soon as it happens.  Remove soap buildup in the tub or shower regularly.  Attach bath mats securely with double-sided  non-slip rug tape.  Do not have throw rugs and other things on the floor that can make you trip. What can I do in the bedroom?  Use night lights.  Make sure that you have a light by your bed that is easy to reach.  Do not use any sheets or blankets that are too big for your bed. They should not hang down onto the floor.  Have a firm chair that has side arms. You can use this for support while you get dressed.  Do not have throw rugs and other things on the floor that can make you trip. What can I do in the kitchen?  Clean up any spills right away.  Avoid walking on wet floors.  Keep items that you use a lot in easy-to-reach places.  If you need to reach something above you, use a strong step stool that has a grab bar.  Keep electrical cords out of the way.  Do not use floor polish or wax that makes floors slippery. If you must use wax, use non-skid floor wax.  Do not have throw rugs and other things on the floor that can make you trip. What can I do  with my stairs?  Do not leave any items on the stairs.  Make sure that there are handrails on both sides of the stairs and use them. Fix handrails that are broken or loose. Make sure that handrails are as long as the stairways.  Check any carpeting to make sure that it is firmly attached to the stairs. Fix any carpet that is loose or worn.  Avoid having throw rugs at the top or bottom of the stairs. If you do have throw rugs, attach them to the floor with carpet tape.  Make sure that you have a light switch at the top of the stairs and the bottom of the stairs. If you do not have them, ask someone to add them for you. What else can I do to help prevent falls?  Wear shoes that:  Do not have high heels.  Have rubber bottoms.  Are comfortable and fit you well.  Are closed at the toe. Do not wear sandals.  If you use a stepladder:  Make sure that it is fully opened. Do not climb a closed stepladder.  Make sure that both  sides of the stepladder are locked into place.  Ask someone to hold it for you, if possible.  Clearly mark and make sure that you can see:  Any grab bars or handrails.  First and last steps.  Where the edge of each step is.  Use tools that help you move around (mobility aids) if they are needed. These include:  Canes.  Walkers.  Scooters.  Crutches.  Turn on the lights when you go into a dark area. Replace any light bulbs as soon as they burn out.  Set up your furniture so you have a clear path. Avoid moving your furniture around.  If any of your floors are uneven, fix them.  If there are any pets around you, be aware of where they are.  Review your medicines with your doctor. Some medicines can make you feel dizzy. This can increase your chance of falling. Ask your doctor what other things that you can do to help prevent falls. This information is not intended to replace advice given to you by your health care provider. Make sure you discuss any questions you have with your health care provider. Document Released: 07/23/2009 Document Revised: 03/03/2016 Document Reviewed: 10/31/2014 Elsevier Interactive Patient Education  2017 [employer].

## 2017-12-11 NOTE — Progress Notes (Signed)
Subjective:   Brad Myers is a 72 y.o. male who presents for Medicare Annual/Subsequent preventive examination.  Review of Systems:  N/A  Cardiac Risk Factors include: advanced age (>34men, >75 women);smoking/ tobacco exposure;sedentary lifestyle;dyslipidemia;hypertension;male gender     Objective:    Vitals: BP 124/82 (BP Location: Left Arm)   Pulse 72   Temp 97.9 F (36.6 C) (Oral)   Ht 5\' 5"  (1.651 m)   Wt 136 lb 3.2 oz (61.8 kg)   BMI 22.66 kg/m   Body mass index is 22.66 kg/m.  Advanced Directives 12/11/2017 11/29/2016  Does Patient Have a Medical Advance Directive? Yes Yes  Type of Paramedic of [place];Living will [place];Living will  Copy of [place] in Chart? Yes No - copy requested    Tobacco Social History   Tobacco Use  Smoking Status Current Every Day Smoker  . Packs/day: 0.25  . Years: 55.00  . Pack years: 13.75  . Types: Cigarettes  Smokeless Tobacco Current User  . Types: Chew     Ready to quit: No Counseling given: No   Clinical Intake:  Pre-visit preparation completed: Yes  Pain : No/denies pain Pain Score: 0-No pain     Nutritional Status: BMI of 19-24  Normal Nutritional Risks: None Diabetes: No  How often do you need to have someone help you when you read instructions, pamphlets, or other written materials from your doctor or pharmacy?: 1 - Never  Interpreter Needed?: No  Information entered by :: [employer], LPN  Past Medical History:  Diagnosis Date  . History of chicken pox 06/24/2015   DID have Chicken Pox. DID have Measles. DID have Mumps.    . Hyperlipidemia   . Hypertension   . Personal history of tobacco use, presenting hazards to health 07/09/2015   Past Surgical History:  Procedure Laterality Date  . EYE SURGERY Right 2003  . HERNIA REPAIR Right 2005   Family History  Problem Relation Age of Onset  . Diabetes Father   . Coronary artery disease  Father    Social History   Socioeconomic History  . Marital status: Divorced    Spouse name: None  . Number of children: 1  . Years of education: None  . Highest education level: Some college, no degree  Social Needs  . Financial resource strain: Not hard at all  . Food insecurity - worry: Never true  . Food insecurity - inability: Never true  . Transportation needs - medical: No  . Transportation needs - non-medical: No  Occupational History  . Occupation: retired  Tobacco Use  . Smoking status: Current Every Day Smoker    Packs/day: 0.25    Years: 55.00    Pack years: 13.75    Types: Cigarettes  . Smokeless tobacco: Current User    Types: Chew  Substance and Sexual Activity  . Alcohol use: Yes    Alcohol/week: 7.2 oz    Types: 12 Cans of beer per week  . Drug use: No  . Sexual activity: None  Other Topics Concern  . None  Social History Narrative  . None    Outpatient Encounter Medications as of 12/11/2017  Medication Sig  . aspirin 81 MG EC tablet Take 1 tablet by mouth daily.  Marland Kitchen atorvastatin (LIPITOR) 40 MG tablet Take 1 tablet (40 mg total) by mouth daily.  . Cholecalciferol (VITAMIN D3) 1000 units CAPS Take by mouth daily.  Marland Kitchen NAPROXEN PO Take by mouth  at bedtime. One oral  . TRAZODONE HCL PO Take 50 mg by mouth at bedtime. 1 oral. Specific dose unknown  . triamterene-hydrochlorothiazide (DYAZIDE) 37.5-25 MG capsule Take 1 capsule by mouth daily.  . [DISCONTINUED] Multiple Vitamin (MULTIVITAMIN) capsule Take 1 capsule by mouth daily.   No facility-administered encounter medications on file as of 12/11/2017.     Activities of Daily Living In your present state of health, do you have any difficulty performing the following activities: 12/11/2017  Hearing? N  Vision? N  Difficulty concentrating or making decisions? N  Walking or climbing stairs? N  Dressing or bathing? N  Doing errands, shopping? N  Preparing Food and eating ? N  Using the Toilet? N  In the  past six months, have you accidently leaked urine? N  Do you have problems with loss of bowel control? N  Managing your Medications? N  Managing your Finances? N  Housekeeping or managing your Housekeeping? N  Some recent data might be hidden    Patient Care Team: [name], MD as PCP - General (Family Medicine)   Assessment:   This is a routine wellness examination for Brad Myers.  Exercise Activities and Dietary recommendations Current Exercise Habits: The patient does not participate in regular exercise at present, Exercise limited by: None identified  Goals    . DIET - INCREASE WATER INTAKE     Recommend increasing water intake to 2-4 glasses a day (currently drinking none).       Fall Risk Fall Risk  12/11/2017 11/29/2016 11/29/2016 06/26/2015  Falls in the past year? No No No No   Is the patient's home free of loose throw rugs in walkways, pet beds, electrical cords, etc?   yes      Grab bars in the bathroom? no      Handrails on the stairs?   no      Adequate lighting?   yes  Timed Get Up and Go Performed: N/A  Depression Screen PHQ 2/9 Scores 12/11/2017 11/29/2016 11/29/2016 06/26/2015  PHQ - 2 Score 2 0 0 0  PHQ- 9 Score 3 - - -    Cognitive Function: Pt declined screening today.     6CIT Screen 11/29/2016  What Year? 0 points  What month? 0 points  What time? 0 points  Count back from 20 0 points  Months in reverse 0 points  Repeat phrase 0 points  Total Score 0    Immunization History  Administered Date(s) Administered  . Influenza, High Dose Seasonal PF 06/26/2015  . Pneumococcal Conjugate-13 07/18/2014  . Pneumococcal Polysaccharide-23 01/23/2012  . Tdap 01/23/2012  . Zoster 05/18/2012    Qualifies for Shingles Vaccine? Due for Shingles vaccine. Declined my offer to administer today. Education has been provided regarding the importance of this vaccine. Pt has been advised to call her insurance company to determine her out of pocket expense. Advised  she may also receive this vaccine at her local pharmacy or Health Dept. Verbalized acceptance and understanding.  Screening Tests Health Maintenance  Topic Date Due  . COLONOSCOPY  01/09/2017  . TETANUS/TDAP  01/22/2022  . INFLUENZA VACCINE  Completed  . Hepatitis C Screening  Completed  . PNA vac Low Risk Adult  Completed   Cancer Screenings: Lung: Low Dose CT Chest recommended if Age 89-80 years, 30 pack-year currently smoking OR have quit w/in 15years. Patient does qualify however he has had this completed 02/2017. Colorectal: Pt declines today.   Additional Screenings:  Hepatitis C  Screening: Up to date    Plan:  I have personally reviewed and addressed the Medicare Annual Wellness questionnaire and have noted the following in the patient's chart:  A. Medical and social history B. Use of alcohol, tobacco or illicit drugs  C. Current medications and supplements D. Functional ability and status E.  Nutritional status F.  Physical activity G. Advance directives H. List of other physicians I.  Hospitalizations, surgeries, and ER visits in previous 12 months J.  [place] such as hearing and vision if needed, cognitive and depression L. Referrals and appointments - none  In addition, I have reviewed and discussed with patient certain preventive protocols, quality metrics, and best practice recommendations. A written personalized care plan for preventive services as well as general preventive health recommendations were provided to patient.  See attached scanned questionnaire for additional information.   Signed,  Fabio Neighbors, LPN Nurse Health Advisor   Nurse Recommendations: Pt declined a colonoscopy referral today. Offered information regarding the cologuard and pt also declined that as well.

## 2017-12-13 ENCOUNTER — Telehealth: Payer: Self-pay | Admitting: Family Medicine

## 2017-12-13 ENCOUNTER — Ambulatory Visit (INDEPENDENT_AMBULATORY_CARE_PROVIDER_SITE_OTHER): Payer: Medicare Other | Admitting: Family Medicine

## 2017-12-13 ENCOUNTER — Encounter: Payer: Self-pay | Admitting: Family Medicine

## 2017-12-13 VITALS — BP 150/80 | HR 73 | Temp 98.1°F | Resp 18 | Ht 65.0 in | Wt 137.0 lb

## 2017-12-13 DIAGNOSIS — F17210 Nicotine dependence, cigarettes, uncomplicated: Secondary | ICD-10-CM

## 2017-12-13 DIAGNOSIS — G4700 Insomnia, unspecified: Secondary | ICD-10-CM

## 2017-12-13 DIAGNOSIS — L989 Disorder of the skin and subcutaneous tissue, unspecified: Secondary | ICD-10-CM | POA: Diagnosis not present

## 2017-12-13 DIAGNOSIS — I10 Essential (primary) hypertension: Secondary | ICD-10-CM

## 2017-12-13 DIAGNOSIS — E785 Hyperlipidemia, unspecified: Secondary | ICD-10-CM | POA: Diagnosis not present

## 2017-12-13 DIAGNOSIS — Z86010 Personal history of colonic polyps: Secondary | ICD-10-CM | POA: Diagnosis not present

## 2017-12-13 MED ORDER — TRAZODONE HCL 50 MG PO TABS: 50.0000 mg | ORAL_TABLET | Freq: Every day | ORAL | 5 refills | Status: DC

## 2017-12-13 MED ORDER — TRIAMTERENE-HCTZ 37.5-25 MG PO CAPS: 1.0000 | ORAL_CAPSULE | Freq: Every day | ORAL | 5 refills | Status: DC

## 2017-12-13 NOTE — Telephone Encounter (Signed)
FYI--Pt does not want to schedule colonoscopy at this time

## 2017-12-13 NOTE — Patient Instructions (Addendum)
   The CDC recommends two doses of Shingrix (the shingles vaccine) separated by 2 to 6 months for adults age 72 years and older. I recommend checking with your insurance plan regarding coverage for this vaccine.    Please stop smoking   Please contact your dermatologist to evaluated the skin lesion on your left leg.

## 2017-12-13 NOTE — Progress Notes (Signed)
Patient: Brad Myers Male    DOB: 03-14-1946   72 y.o.   MRN: [id number] Visit Date: 12/13/2017  Today's Provider: Lelon Huh, MD   Chief Complaint  Patient presents with  . Hypertension  . Hyperglycemia  . Hyperlipidemia  . Insomnia   Subjective:     Patient saw [facility] for AWV on 12/11/2017.  HPI   Hypertension, follow-up:  BP Readings from Last 3 Encounters:  12/11/17 124/82  04/21/17 130/70  11/29/16 138/74    He was last seen for hypertension 8 months ago.  BP at that visit was 130/70. Management since that visit includes; no changes.He reports poor compliance with treatment. Patient has not taken any blood pressure medication in several months. He states he wasn't sure if he should still take it. Patient was previously getting medications from the [place].  He is not having side effects.  He is not exercising. He is adherent to low salt diet.   Outside blood pressures are occasionally checked. He is experiencing none.  Patient denies chest pain, chest pressure/discomfort, claudication, dyspnea, exertional chest pressure/discomfort, fatigue, irregular heart beat, lower extremity edema, near-syncope, orthopnea, palpitations, paroxysmal nocturnal dyspnea, syncope and tachypnea.   Cardiovascular risk factors include advanced age (older than 27 for men, 70 for women), hypertension and male gender.  Use of agents associated with hypertension: NSAIDS.   ------------------------------------------------------------------------    Lipid/Cholesterol, Follow-up:   Last seen for this 8 months ago.  Management since that visit includes; labs checked, no changes.  Last Lipid Panel:    Component Value Date/Time   CHOL 138 04/21/2017 0917   TRIG 148 04/21/2017 0917   HDL 50 04/21/2017 0917   CHOLHDL 2.8 04/21/2017 0917   LDLCALC 58 04/21/2017 0917    He reports good compliance with treatment. He is not having side effects.   Wt Readings from Last 3 Encounters:   12/11/17 136 lb 3.2 oz (61.8 kg)  04/21/17 128 lb (58.1 kg)  11/29/16 132 lb (59.9 kg)    ------------------------------------------------------------------------  Insomnia, unspecified type From 04/21/2017-no changes. Doing well with trazodone 50 mg. Patient reports good compliance with treatment, good tolerance and good symptom control.   Hyperglycemia From 04/21/2017-no changes. Hemoglobin A1c 5.1.Patiet reports that he has not been watching his diet or exercising regularly.    No Known Allergies   Current Outpatient Medications:  .  aspirin 81 MG EC tablet, Take 1 tablet by mouth daily., Disp: , Rfl:  .  atorvastatin (LIPITOR) 40 MG tablet, Take 1 tablet (40 mg total) by mouth daily., Disp: 30 tablet, Rfl: 3 .  Cholecalciferol (VITAMIN D3) 1000 units CAPS, Take by mouth daily., Disp: , Rfl:  .  NAPROXEN PO, Take by mouth at bedtime. One oral, Disp: , Rfl:  .  TRAZODONE HCL PO, Take 50 mg by mouth at bedtime. 1 oral. Specific dose unknown, Disp: , Rfl:  .  triamterene-hydrochlorothiazide (DYAZIDE) 37.5-25 MG capsule, Take 1 capsule by mouth daily., Disp: , Rfl:   Review of Systems  Constitutional: Negative for appetite change, chills, fatigue and fever.  HENT: Negative for congestion, ear pain, hearing loss, nosebleeds and trouble swallowing.   Eyes: Negative for pain and visual disturbance.  Respiratory: Negative for cough, chest tightness and shortness of breath.   Cardiovascular: Negative for chest pain, palpitations and leg swelling.  Gastrointestinal: Negative for abdominal pain, blood in stool, constipation, diarrhea, nausea and vomiting.  Endocrine: Negative for polydipsia, polyphagia and polyuria.  Genitourinary: Negative for  dysuria and flank pain.  Musculoskeletal: Negative for arthralgias, back pain, joint swelling, myalgias and neck stiffness.  Skin: Negative for color change, rash and wound.  Neurological: Negative for dizziness, tremors, seizures, speech  difficulty, weakness, light-headedness and headaches.  Psychiatric/Behavioral: Negative for behavioral problems, confusion, decreased concentration, dysphoric mood and sleep disturbance. The patient is not nervous/anxious.   All other systems reviewed and are negative.   Social History   Tobacco Use  . Smoking status: Current Every Day Smoker    Packs/day: 0.25    Years: 55.00    Pack years: 13.75    Types: Cigarettes  . Smokeless tobacco: Current User    Types: Chew  Substance Use Topics  . Alcohol use: Yes    Alcohol/week: 7.2 oz    Types: 12 Cans of beer per week   Objective:   BP (!) 150/80 (BP Location: Right Arm, Cuff Size: Normal)   Pulse 73   Temp 98.1 F (36.7 C) (Oral)   Resp 18   Ht 5\' 5"  (1.651 m)   Wt 137 lb (62.1 kg)   SpO2 98% Comment: room air  BMI 22.80 kg/m  Vitals:   12/13/17 1410 12/13/17 1413  BP: (!) 142/80 (!) 150/80  Pulse: 73   Resp: 18   Temp: 98.1 F (36.7 C)   TempSrc: Oral   SpO2: 98%   Weight: 137 lb (62.1 kg)   Height: 5\' 5"  (1.651 m)      Physical Exam   General Appearance:    Alert, cooperative, no distress  Eyes:    PERRL, conjunctiva/corneas clear, EOM's intact       Lungs:     Clear to auscultation bilaterally, respirations unlabored  Heart:    Regular rate and rhythm  Neurologic:   Awake, alert, oriented x 3. No apparent focal neurological           defect.   Skin:     About 1cm raised circular skin lesion with central ulceration left anterior leg.        Assessment & Plan:     1. Essential (primary) hypertension Was well controlled before he ran of- triamterene-hydrochlorothiazide (DYAZIDE) 37.5-25 MG capsule; Take 1 each (1 capsule total) by mouth daily.  Dispense: 30 capsule; Refill: 5 - EKG 12-Lead  2. Hyperlipidemia, unspecified hyperlipidemia type He is tolerating atorvastatin well with no adverse effects.  Will check lipids annualy.   3. Skin lesion of left leg He states he is followed at [facility]  Dermatology he will call soon to schedule evaluation.   4. Smoking greater than 30 pack years He declined offer for smoking cessation medications, but plans on stopping cold [place] in April   5. Insomnia, unspecified type Well controlled  - traZODone (DESYREL) 50 MG tablet; Take 1 tablet (50 mg total) by mouth at bedtime. 1 oral. Specific dose unknown  Dispense: 30 tablet; Refill: 5  6. History of adenomatous polyp of colon He is overdue for follow up colonoscopy. He is reluctant to return for follow up since he states he was terribly constipated after last procedure. Advised to discuss this with GI when the test is scheduled.  - Ambulatory referral to Gastroenterology  Return in about 6 months (around 06/15/2018) for hypertension.       Lelon Huh, MD  [facility]

## 2017-12-13 NOTE — Addendum Note (Signed)
Addended by: [name] on: 12/13/2017 02:58 PM   Modules accepted: Level of Service

## 2017-12-21 DIAGNOSIS — D485 Neoplasm of uncertain behavior of skin: Secondary | ICD-10-CM | POA: Diagnosis not present

## 2017-12-21 DIAGNOSIS — C44729 Squamous cell carcinoma of skin of left lower limb, including hip: Secondary | ICD-10-CM | POA: Diagnosis not present

## 2018-01-15 DIAGNOSIS — L905 Scar conditions and fibrosis of skin: Secondary | ICD-10-CM | POA: Diagnosis not present

## 2018-01-15 DIAGNOSIS — C44729 Squamous cell carcinoma of skin of left lower limb, including hip: Secondary | ICD-10-CM | POA: Diagnosis not present

## 2018-02-19 ENCOUNTER — Telehealth: Payer: Self-pay | Admitting: *Deleted

## 2018-02-19 DIAGNOSIS — Z122 Encounter for screening for malignant neoplasm of respiratory organs: Secondary | ICD-10-CM

## 2018-02-19 DIAGNOSIS — Z87891 Personal history of nicotine dependence: Secondary | ICD-10-CM

## 2018-02-19 NOTE — Telephone Encounter (Signed)
Notified patient that annual lung cancer screening low dose CT scan is due currently or will be in near future. Confirmed that patient is within the age range of 55-77, and asymptomatic, (no signs or symptoms of lung cancer). Patient denies illness that would prevent curative treatment for lung cancer if found. Verified smoking history, (current, 31.5 pack year). The shared decision making visit was done 07/17/15. Patient is agreeable for CT scan being scheduled.

## 2018-02-19 NOTE — Telephone Encounter (Signed)
Left message for patient to notify them that it is time to schedule annual low dose lung cancer screening CT scan. Instructed patient to call back to verify information prior to the scan being scheduled.  

## 2018-02-20 NOTE — Telephone Encounter (Signed)
error 

## 2018-02-23 ENCOUNTER — Ambulatory Visit
Admission: RE | Admit: 2018-02-23 | Discharge: 2018-02-23 | Disposition: A | Discharge status: Home / Self Care | Payer: Medicare Other | Source: Ambulatory Visit | Attending: Oncology | Admitting: Oncology

## 2018-02-23 DIAGNOSIS — Z87891 Personal history of nicotine dependence: Secondary | ICD-10-CM | POA: Insufficient documentation

## 2018-02-23 DIAGNOSIS — F17210 Nicotine dependence, cigarettes, uncomplicated: Secondary | ICD-10-CM | POA: Diagnosis not present

## 2018-02-23 DIAGNOSIS — I2510 Atherosclerotic heart disease of native coronary artery without angina pectoris: Secondary | ICD-10-CM | POA: Diagnosis not present

## 2018-02-23 DIAGNOSIS — Z122 Encounter for screening for malignant neoplasm of respiratory organs: Secondary | ICD-10-CM | POA: Insufficient documentation

## 2018-02-23 DIAGNOSIS — I700 Atherosclerosis of aorta: Secondary | ICD-10-CM | POA: Insufficient documentation

## 2018-02-23 DIAGNOSIS — J439 Emphysema, unspecified: Secondary | ICD-10-CM | POA: Insufficient documentation

## 2018-02-26 ENCOUNTER — Encounter: Payer: Self-pay | Admitting: *Deleted

## 2018-02-28 ENCOUNTER — Other Ambulatory Visit: Payer: Self-pay | Admitting: Family Medicine

## 2018-05-15 DIAGNOSIS — L570 Actinic keratosis: Secondary | ICD-10-CM | POA: Diagnosis not present

## 2018-05-15 DIAGNOSIS — R208 Other disturbances of skin sensation: Secondary | ICD-10-CM | POA: Diagnosis not present

## 2018-05-15 DIAGNOSIS — B078 Other viral warts: Secondary | ICD-10-CM | POA: Diagnosis not present

## 2018-05-15 DIAGNOSIS — X32XXXA Exposure to sunlight, initial encounter: Secondary | ICD-10-CM | POA: Diagnosis not present

## 2018-05-15 DIAGNOSIS — R238 Other skin changes: Secondary | ICD-10-CM | POA: Diagnosis not present

## 2018-06-15 ENCOUNTER — Encounter: Payer: Self-pay | Admitting: Family Medicine

## 2018-06-15 ENCOUNTER — Ambulatory Visit (INDEPENDENT_AMBULATORY_CARE_PROVIDER_SITE_OTHER): Payer: Medicare Other | Admitting: Family Medicine

## 2018-06-15 VITALS — BP 136/70 | HR 76 | Temp 98.4°F | Resp 16 | Wt 133.0 lb

## 2018-06-15 DIAGNOSIS — Z125 Encounter for screening for malignant neoplasm of prostate: Secondary | ICD-10-CM | POA: Diagnosis not present

## 2018-06-15 DIAGNOSIS — Z86010 Personal history of colonic polyps: Secondary | ICD-10-CM

## 2018-06-15 DIAGNOSIS — I10 Essential (primary) hypertension: Secondary | ICD-10-CM

## 2018-06-15 DIAGNOSIS — E785 Hyperlipidemia, unspecified: Secondary | ICD-10-CM | POA: Diagnosis not present

## 2018-06-15 DIAGNOSIS — Z23 Encounter for immunization: Secondary | ICD-10-CM

## 2018-06-15 DIAGNOSIS — F17210 Nicotine dependence, cigarettes, uncomplicated: Secondary | ICD-10-CM | POA: Diagnosis not present

## 2018-06-15 NOTE — Addendum Note (Signed)
Addended by: Wilburt Finlay L on: 06/15/2018 11:21 AM   Modules accepted: Orders

## 2018-06-15 NOTE — Patient Instructions (Signed)
   The CDC recommends two doses of Shingrix (the shingles vaccine) separated by 2 to 6 months for adults age 72 years and older. I recommend checking with your insurance plan regarding coverage for this vaccine.   

## 2018-06-15 NOTE — Progress Notes (Signed)
Patient: Brad Myers Male    DOB: [birth year]   72 y.o.   MRN: [id number] Visit Date: 06/15/2018  Today's Provider: Lelon Huh, MD   Chief Complaint  Patient presents with  . Hypertension  . Hyperlipidemia  . Insomnia   Subjective:    HPI  Hypertension, follow-up:  BP Readings from Last 3 Encounters:  06/15/18 136/70  12/13/17 (!) 150/80  12/11/17 124/82    He was last seen for hypertension 6 months ago.  BP at that visit was 142/80. Management since that visit includes advising to start back on medication. He reports good compliance with treatment. He is not having side effects.  He is exercising. He is adherent to low salt diet.   Outside blood pressures are checked occasionally. He is experiencing none.  Patient denies exertional chest pressure/discomfort, lower extremity edema and palpitations.   Cardiovascular risk factors include dyslipidemia.   Weight trend: stable Wt Readings from Last 3 Encounters:  06/15/18 133 lb (60.3 kg)  02/23/18 137 lb (62.1 kg)  12/13/17 137 lb (62.1 kg)    Current diet: well balanced   Lipid/Cholesterol, Follow-up:   Last seen for this6 months ago.  Management changes since that visit include no changes. . Last Lipid Panel:    Component Value Date/Time   CHOL 138 04/21/2017 0917   TRIG 148 04/21/2017 0917   HDL 50 04/21/2017 0917   CHOLHDL 2.8 04/21/2017 0917   LDLCALC 58 04/21/2017 0917    Risk factors for vascular disease include hypertension  Insomnia Patient was last seen in the office 6 months ago. No changes were made in his medications. He is currently taking Trazodone, and reports good compliance and good symptom control.     No Known Allergies   Current Outpatient Medications:  .  aspirin 81 MG EC tablet, Take 1 tablet by mouth daily., Disp: , Rfl:  .  atorvastatin (LIPITOR) 40 MG tablet, TAKE 1 TABLET(40 MG) BY MOUTH DAILY, Disp: 30 tablet, Rfl: 12 .  Cholecalciferol (VITAMIN D3) 1000 units  CAPS, Take by mouth daily., Disp: , Rfl:  .  NAPROXEN PO, Take by mouth at bedtime. One oral, Disp: , Rfl:  .  traZODone (DESYREL) 50 MG tablet, Take 1 tablet (50 mg total) by mouth at bedtime., Disp: 30 tablet, Rfl: 5 .  triamterene-hydrochlorothiazide (DYAZIDE) 37.5-25 MG capsule, Take 1 each (1 capsule total) by mouth daily., Disp: 30 capsule, Rfl: 5  Review of Systems  Constitutional: Negative for activity change, appetite change, chills, diaphoresis, fatigue, fever and unexpected weight change.  Respiratory: Negative for cough and shortness of breath.   Cardiovascular: Negative for chest pain, palpitations and leg swelling.  Endocrine: Negative for cold intolerance, heat intolerance, polydipsia, polyphagia and polyuria.  Allergic/Immunologic: Negative for environmental allergies.  Neurological: Negative for dizziness, syncope, weakness, light-headedness and headaches.  Psychiatric/Behavioral: Negative for agitation, behavioral problems and sleep disturbance. The patient is not nervous/anxious.     Social History   Tobacco Use  . Smoking status: Current Every Day Smoker    Packs/day: 0.25    Years: 55.00    Pack years: 13.75    Types: Cigarettes  . Smokeless tobacco: Current User    Types: Chew  Substance Use Topics  . Alcohol use: Yes    Alcohol/week: 12.0 standard drinks    Types: 12 Cans of beer per week   Objective:   BP 136/70 (BP Location: Right Arm, Patient Position: Sitting, Cuff Size: Normal)  Pulse 76   Temp 98.4 F (36.9 C)   Resp 16   Wt 133 lb (60.3 kg)   SpO2 98%   BMI 21.47 kg/m     Physical Exam   General Appearance:    Alert, cooperative, no distress  Eyes:    PERRL, conjunctiva/corneas clear, EOM's intact       Lungs:     Clear to auscultation bilaterally, respirations unlabored  Heart:    Regular rate and rhythm  Neurologic:   Awake, alert, oriented x 3. No apparent focal neurological           defect.           Assessment & Plan:       1. Essential (primary) hypertension Well controlled.  Continue current medications.   - Comprehensive metabolic panel  2. Smoking greater than 30 pack years Getting yearly LDCT. Has cut back on cigarette. Encouraged to quit enitrely.   3. Hyperlipidemia, unspecified hyperlipidemia type He is tolerating atorvastatin well with no adverse effects.   - Comprehensive metabolic panel - Lipid panel  4. Prostate cancer screening  - PSA  5. History of adenomatous polyp of colon Due for follow up colonoscopy.  - Ambulatory referral to Gastroenterology       Lelon Huh, MD  [facility]

## 2018-06-16 LAB — COMPREHENSIVE METABOLIC PANEL
ALBUMIN: 4.4 g/dL (ref 3.5–4.8)
ALT: 15 IU/L (ref 0–44)
AST: 11 IU/L (ref 0–40)
Albumin/Globulin Ratio: 1.8 (ref 1.2–2.2)
Alkaline Phosphatase: 60 IU/L (ref 39–117)
BILIRUBIN TOTAL: 0.4 mg/dL (ref 0.0–1.2)
BUN / CREAT RATIO: 14 (ref 10–24)
BUN: 14 mg/dL (ref 8–27)
CALCIUM: 9.6 mg/dL (ref 8.6–10.2)
CHLORIDE: 98 mmol/L (ref 96–106)
CO2: 24 mmol/L (ref 20–29)
Creatinine, Ser: 1.03 mg/dL (ref 0.76–1.27)
GFR, EST AFRICAN AMERICAN: 84 mL/min/{1.73_m2} (ref >59)
GFR, EST NON AFRICAN AMERICAN: 73 mL/min/{1.73_m2} (ref >59)
GLUCOSE: 91 mg/dL (ref 65–99)
Globulin, Total: 2.5 g/dL (ref 1.5–4.5)
POTASSIUM: 3.9 mmol/L (ref 3.5–5.2)
Sodium: 138 mmol/L (ref 134–144)
TOTAL PROTEIN: 6.9 g/dL (ref 6.0–8.5)

## 2018-06-16 LAB — LIPID PANEL
CHOL/HDL RATIO: 3.3 ratio (ref 0.0–5.0)
Cholesterol, Total: 144 mg/dL (ref 100–199)
HDL: 44 mg/dL (ref >39)
LDL Calculated: 68 mg/dL (ref 0–99)
Triglycerides: 161 mg/dL — ABNORMAL HIGH (ref 0–149)
VLDL Cholesterol Cal: 32 mg/dL (ref 5–40)

## 2018-06-16 LAB — PSA: PROSTATE SPECIFIC AG, SERUM: 0.8 ng/mL (ref 0.0–4.0)

## 2018-06-20 ENCOUNTER — Other Ambulatory Visit: Payer: Self-pay

## 2018-06-20 DIAGNOSIS — Z86010 Personal history of colonic polyps: Principal | ICD-10-CM

## 2018-06-20 DIAGNOSIS — Z1211 Encounter for screening for malignant neoplasm of colon: Secondary | ICD-10-CM

## 2018-07-11 ENCOUNTER — Ambulatory Visit: Payer: Medicare Other | Admitting: Anesthesiology

## 2018-07-11 ENCOUNTER — Ambulatory Visit
Admission: RE | Admit: 2018-07-11 | Discharge: 2018-07-11 | Disposition: A | Discharge status: Home / Self Care | Payer: Medicare Other | Source: Ambulatory Visit | Attending: Gastroenterology | Admitting: Gastroenterology

## 2018-07-11 ENCOUNTER — Encounter
Admission: RE | Disposition: A | Discharge status: Home / Self Care | Payer: Self-pay | Source: Ambulatory Visit | Attending: Gastroenterology

## 2018-07-11 ENCOUNTER — Other Ambulatory Visit: Payer: Self-pay

## 2018-07-11 DIAGNOSIS — D124 Benign neoplasm of descending colon: Secondary | ICD-10-CM | POA: Diagnosis not present

## 2018-07-11 DIAGNOSIS — D122 Benign neoplasm of ascending colon: Secondary | ICD-10-CM

## 2018-07-11 DIAGNOSIS — E785 Hyperlipidemia, unspecified: Secondary | ICD-10-CM | POA: Diagnosis not present

## 2018-07-11 DIAGNOSIS — I10 Essential (primary) hypertension: Secondary | ICD-10-CM | POA: Diagnosis not present

## 2018-07-11 DIAGNOSIS — D125 Benign neoplasm of sigmoid colon: Secondary | ICD-10-CM | POA: Diagnosis not present

## 2018-07-11 DIAGNOSIS — Z86010 Personal history of colonic polyps: Secondary | ICD-10-CM

## 2018-07-11 DIAGNOSIS — F17210 Nicotine dependence, cigarettes, uncomplicated: Secondary | ICD-10-CM | POA: Diagnosis not present

## 2018-07-11 DIAGNOSIS — D121 Benign neoplasm of appendix: Secondary | ICD-10-CM | POA: Diagnosis not present

## 2018-07-11 DIAGNOSIS — D123 Benign neoplasm of transverse colon: Secondary | ICD-10-CM

## 2018-07-11 DIAGNOSIS — Z1211 Encounter for screening for malignant neoplasm of colon: Secondary | ICD-10-CM

## 2018-07-11 DIAGNOSIS — K635 Polyp of colon: Secondary | ICD-10-CM | POA: Diagnosis not present

## 2018-07-11 DIAGNOSIS — D120 Benign neoplasm of cecum: Secondary | ICD-10-CM

## 2018-07-11 DIAGNOSIS — K219 Gastro-esophageal reflux disease without esophagitis: Secondary | ICD-10-CM | POA: Diagnosis not present

## 2018-07-11 DIAGNOSIS — J449 Chronic obstructive pulmonary disease, unspecified: Secondary | ICD-10-CM | POA: Insufficient documentation

## 2018-07-11 HISTORY — PX: COLONOSCOPY WITH PROPOFOL: SHX5780

## 2018-07-11 SURGERY — COLONOSCOPY WITH PROPOFOL
Anesthesia: General

## 2018-07-11 MED ORDER — LIDOCAINE 2% (20 MG/ML) 5 ML SYRINGE
INTRAMUSCULAR | Status: DC | PRN
  Administered 2018-07-11: 30 mg via INTRAVENOUS

## 2018-07-11 MED ORDER — PROPOFOL 10 MG/ML IV BOLUS
INTRAVENOUS | Status: DC | PRN
  Administered 2018-07-11: 100 mg via INTRAVENOUS

## 2018-07-11 MED ORDER — PROPOFOL 500 MG/50ML IV EMUL
INTRAVENOUS | Status: AC
  Filled 2018-07-11: qty 50

## 2018-07-11 MED ORDER — FENTANYL CITRATE (PF) 100 MCG/2ML IJ SOLN
INTRAMUSCULAR | Status: DC | PRN
  Administered 2018-07-11 (×2): 50 ug via INTRAVENOUS

## 2018-07-11 MED ORDER — SODIUM CHLORIDE 0.9 % IV SOLN
INTRAVENOUS | Status: DC
  Administered 2018-07-11: 13:00:00 via INTRAVENOUS

## 2018-07-11 MED ORDER — LIDOCAINE HCL (PF) 2 % IJ SOLN
INTRAMUSCULAR | Status: AC
  Filled 2018-07-11: qty 10

## 2018-07-11 MED ORDER — SODIUM CHLORIDE 0.9 % IJ SOLN
INTRAMUSCULAR | Status: DC | PRN
  Administered 2018-07-11: 13 mL

## 2018-07-11 MED ORDER — FENTANYL CITRATE (PF) 100 MCG/2ML IJ SOLN
INTRAMUSCULAR | Status: AC
  Filled 2018-07-11: qty 2

## 2018-07-11 MED ORDER — PROPOFOL 500 MG/50ML IV EMUL
INTRAVENOUS | Status: DC | PRN
  Administered 2018-07-11: 170 ug/kg/min via INTRAVENOUS

## 2018-07-11 NOTE — Op Note (Signed)
[facility]enter[facility] Patient Name: Brad Myers Procedure Date: 07/11/2018 1:01 PM MRN: [id number] Account #: [id number] Date of Birth: Apr 25, 1946 Admit Type: Outpatient Age: 72 Room: [facility] ENDO ROOM 3 Gender: Male Note Status: Finalized Procedure:            Colonoscopy Indications:          High risk colon cancer surveillance: Personal history                        of colonic polyps Providers:            [name] MD, MD Referring MD:         Kirstie Peri. Caryn Section, MD (Referring MD) Medicines:            Monitored Anesthesia Care Complications:        No immediate complications. Procedure:            Pre-Anesthesia Assessment:                       - Prior to the procedure, a History and Physical was                        performed, and patient medications, allergies and                        sensitivities were reviewed. The patient's tolerance of                        previous anesthesia was reviewed.                       - The risks and benefits of the procedure and the                        sedation options and risks were discussed with the                        patient. All questions were answered and informed                        consent was obtained.                       - ASA Grade Assessment: II - A patient with mild                        systemic disease.                       After obtaining informed consent, the colonoscope was                        passed under direct vision. Throughout the procedure,                        the patient's blood pressure, pulse, and oxygen                        saturations were monitored continuously. The  Colonoscope was introduced through the and advanced to                        the the cecum, identified by the appendiceal orifice,                        IC valve and transillumination. The colonoscopy was                        performed with ease. The patient tolerated the                         procedure well. The quality of the bowel preparation                        was good. Findings:      The perianal and digital rectal examinations were normal.      A 6 mm polyp was found in the cecum. The polyp was sessile. The polyp       was removed with a cold snare. Resection and retrieval were complete.      Six sessile polyps were found in the ascending colon. The polyps were 5       to 8 mm in size. These polyps were removed with a cold snare. Resection       and retrieval were complete.      Two sessile polyps were found in the transverse colon and ascending       colon. The polyps were 10 to 14 mm in size. Preparations were made for       mucosal resection. 10 mL of saline was injected with adequate lift of       the lesion from the muscularis propria. Snare mucosal resection with       suction (via the working channel) retrieval was performed. A       medium-sized area was resected. Resection and retrieval were complete.       There was no bleeding during, and at the end, of the procedure.      Five sessile polyps were found in the transverse colon. The polyps were       6 to 8 mm in size. These polyps were removed with a cold snare.       Resection and retrieval were complete.      A 6 mm polyp was found in the descending colon. The polyp was sessile.       The polyp was removed with a cold snare. Resection and retrieval were       complete.      A 10 mm polyp was found in the transverse colon. The polyp was sessile.       Preparations were made for mucosal resection. 5 mL of saline was       injected with adequate lift of the lesion from the muscularis propria.       Snare mucosal resection with suction (via the working channel) retrieval       was performed. A 12 mm area was resected. Resection and retrieval were       complete. There was no bleeding during, and at the end, of the procedure.      A 12 mm polyp was found in the sigmoid colon. The polyp was        semi-pedunculated. The polyp was  removed with a hot snare. Resection and       retrieval were complete. To prevent bleeding after the polypectomy, one       hemostatic clip was successfully placed. There was no bleeding during,       or at the end, of the procedure.      Two sessile polyps were found in the sigmoid colon. The polyps were 3 to       5 mm in size. These polyps were removed with a cold biopsy forceps.       Resection and retrieval were complete.      A 5 mm polyp was found in the sigmoid colon. The polyp was sessile. The       polyp was removed with a cold snare. Resection and retrieval were       complete.      A medium polyp was found in the appendiceal orifice. The polyp seemed       semi pedunculated, I could not get good visualization for polypectomy      The exam was otherwise without abnormality on direct and retroflexion       views. Impression:           - One 6 mm polyp in the cecum, removed with a cold                        snare. Resected and retrieved.                       - Six 5 to 8 mm polyps in the ascending colon, removed                        with a cold snare. Resected and retrieved.                       - Two 10 to 14 mm polyps in the transverse colon and in                        the ascending colon, removed with mucosal resection.                        Resected and retrieved.                       - Five 6 to 8 mm polyps in the transverse colon,                        removed with a cold snare. Resected and retrieved.                       - One 6 mm polyp in the descending colon, removed with                        a cold snare. Resected and retrieved.                       - One 10 mm polyp in the transverse colon, removed with                        mucosal resection. Resected and retrieved.                       -  One 12 mm polyp in the sigmoid colon, removed with a                        hot snare. Resected and retrieved. Clip was  placed.                       - Two 3 to 5 mm polyps in the sigmoid colon, removed                        with a cold biopsy forceps. Resected and retrieved.                       - One 5 mm polyp in the sigmoid colon, removed with a                        cold snare. Resected and retrieved.                       - One medium polyp at the appendiceal orifice.                       - The examination was otherwise normal on direct and                        retroflexion views.                       - Mucosal resection was performed. Resection and                        retrieval were complete.                       - Mucosal resection was performed. Resection and                        retrieval were complete.                       - Procedure took 32 minutes much longer than usual 15                        minutes due to large number of polypds, multiple                        modalities of resections Recommendation:       - Discharge patient to home (with escort).                       - Resume previous diet.                       - No aspirin, ibuprofen, naproxen, or other                        non-steroidal anti-inflammatory drugs for 6 weeks after                        polyp removal.                       -  Refer to oncology for genetci testing to evaluate for                        attenuated FAP                       Refer to Dr Early Osmond to excise polyp in appendicular                        orifice if fails needs surgery Procedure Code(s):    --- Professional ---                       [id number], 40, Colonoscopy, flexible; with endoscopic                        mucosal resection                       [phone], Colonoscopy, flexible; with removal of tumor(s),                        polyp(s), or other lesion(s) by snare technique                       [id number], 13, Colonoscopy, flexible; with biopsy, single                        or multiple Diagnosis Code(s):    --- Professional ---                        Z86.010, Personal history of colonic polyps                       D12.0, Benign neoplasm of cecum                       D12.4, Benign neoplasm of descending colon                       D12.3, Benign neoplasm of transverse colon (hepatic                        flexure or splenic flexure)                       D12.5, Benign neoplasm of sigmoid colon                       D12.1, Benign neoplasm of appendix                       D12.2, Benign neoplasm of ascending colon CPT copyright 2017 [employer]. All rights reserved. The codes documented in this report are preliminary and upon coder review may  be revised to meet current compliance requirements. [name], MD [name] MD, MD 07/11/2018 1:59:41 PM This report has been signed electronically. Number of Addenda: 0 Note Initiated On: 07/11/2018 1:01 PM Scope Withdrawal Time: 0 hours 32 minutes 30 seconds  Total Procedure Duration: 0 hours 36 minutes 47 seconds       [facility]

## 2018-07-11 NOTE — Anesthesia Post-op Follow-up Note (Signed)
Anesthesia QCDR form completed.        

## 2018-07-11 NOTE — H&P (Signed)
Brad Bellows, MD [address] [place], [place], [place], [address] [address], [place], [place], [place], [address] Phone: [phone]  Fax: [phone]  Primary Care Physician:  [name], MD   Pre-Procedure History & Physical: HPI:  Brad Myers is a 72 y.o. male is here for an colonoscopy.   Past Medical History:  Diagnosis Date  . History of chicken pox 06/24/2015   DID have Chicken Pox. DID have Measles. DID have Mumps.    . Hyperlipidemia   . Hypertension   . Personal history of tobacco use, presenting hazards to health 07/09/2015    Past Surgical History:  Procedure Laterality Date  . EYE SURGERY Right 2003  . HERNIA REPAIR Right 2005    Prior to Admission medications   Medication Sig Start Date End Date Taking? Authorizing Provider  atorvastatin (LIPITOR) 40 MG tablet TAKE 1 TABLET(40 MG) BY MOUTH DAILY 02/28/18  Yes [name], MD  Cholecalciferol (VITAMIN D3) 1000 units CAPS Take by mouth daily.   Yes [provider]  NAPROXEN PO Take by mouth at bedtime. One oral   Yes [provider]  traZODone (DESYREL) 50 MG tablet Take 1 tablet (50 mg total) by mouth at bedtime. 12/13/17  Yes [name], MD  triamterene-hydrochlorothiazide (DYAZIDE) 37.5-25 MG capsule Take 1 each (1 capsule total) by mouth daily. 12/13/17  Yes [name], MD    Allergies as of 06/20/2018  . (No Known Allergies)    Family History  Problem Relation Age of Onset  . Diabetes Father   . Coronary artery disease Father     Social History   Socioeconomic History  . Marital status: Divorced    Spouse name: Not on file  . Number of children: 1  . Years of education: Not on file  . Highest education level: Some college, no degree  Occupational History  . Occupation: retired  Scientific laboratory technician  . Financial resource strain: Not hard at all  . Food insecurity:    Worry: Never true    Inability: Never true  . Transportation needs:    Medical:  No    Non-medical: No  Tobacco Use  . Smoking status: Current Every Day Smoker    Packs/day: 0.25    Years: 55.00    Pack years: 13.75    Types: Cigarettes  Substance and Sexual Activity  . Alcohol use: Yes    Alcohol/week: 12.0 standard drinks    Types: 12 Cans of beer per week  . Drug use: No  . Sexual activity: Not on file  Lifestyle  . Physical activity:    Days per week: Not on file    Minutes per session: Not on file  . Stress: Not at all  Relationships  . Social connections:    Talks on phone: Not on file    Gets together: Not on file    Attends religious service: Not on file    Active member of club or organization: Not on file    Attends meetings of clubs or organizations: Not on file    Relationship status: Not on file  . Intimate partner violence:    Fear of current or ex partner: Not on file    Emotionally abused: Not on file    Physically abused: Not on file    Forced sexual activity: Not on file  Other Topics Concern  . Not on file  Social History Narrative  . Not on  file    Review of Systems: See HPI, otherwise negative ROS  Physical Exam: BP (!) 149/95   Pulse (!) 103   Temp (!) 97 F (36.1 C) (Tympanic)   Resp 16   Ht 5\' 5"  (1.651 m)   Wt 60.3 kg   SpO2 100%   BMI 22.13 kg/m  General:   Alert,  pleasant and cooperative in NAD Head:  Normocephalic and atraumatic. Neck:  Supple; no masses or thyromegaly. Lungs:  Clear throughout to auscultation, normal respiratory effort.    Heart:  +S1, +S2, Regular rate and rhythm, No edema. Abdomen:  Soft, nontender and nondistended. Normal bowel sounds, without guarding, and without rebound.   Neurologic:  Alert and  oriented x4;  grossly normal neurologically.  Impression/Plan: [name] is here for an colonoscopy to be performed for surveillance due to prior history of colon polyps   Risks, benefits, limitations, and alternatives regarding  colonoscopy have been reviewed with the patient.   Questions have been answered.  All parties agreeable.   Brad Bellows, MD  07/11/2018, 12:59 PM

## 2018-07-11 NOTE — Transfer of Care (Signed)
Immediate Anesthesia Transfer of Care Note  Patient: Brad Myers  Procedure(s) Performed: COLONOSCOPY WITH PROPOFOL (N/A )  Patient Location: PACU and Endoscopy Unit  Anesthesia Type:General  Level of Consciousness: sedated  Airway & Oxygen Therapy: Patient Spontanous Breathing and Patient connected to nasal cannula oxygen  Post-op Assessment: Report given to RN and Post -op Vital signs reviewed and stable  Post vital signs: Reviewed and stable  Last Vitals:  Vitals Value Taken Time  BP    Temp    Pulse    Resp    SpO2      Last Pain:  Vitals:   07/11/18 1233  TempSrc: Tympanic  PainSc: 0-No pain         Complications: No apparent anesthesia complications

## 2018-07-11 NOTE — Anesthesia Postprocedure Evaluation (Signed)
Anesthesia Post Note  Patient: Brad Myers  Procedure(s) Performed: COLONOSCOPY WITH PROPOFOL (N/A )  Patient location during evaluation: Endoscopy Anesthesia Type: General Level of consciousness: awake and alert and oriented Pain management: pain level controlled Vital Signs Assessment: post-procedure vital signs reviewed and stable Respiratory status: spontaneous breathing, nonlabored ventilation and respiratory function stable Cardiovascular status: blood pressure returned to baseline and stable Postop Assessment: no signs of nausea or vomiting Anesthetic complications: no     Last Vitals:  Vitals:   07/11/18 1402 07/11/18 1412  BP: 98/71 (!) 147/79  Pulse: 76 90  Resp: 11 (!) 26  Temp:    SpO2: 100% 100%    Last Pain:  Vitals:   07/11/18 1412  TempSrc:   PainSc: 0-No pain                 Niklas Chretien

## 2018-07-11 NOTE — Anesthesia Preprocedure Evaluation (Addendum)
Anesthesia Evaluation  Patient identified by MRN, date of birth, ID band Patient awake    Reviewed: Allergy & Precautions, NPO status , Patient's Chart, lab work & pertinent test results  History of Anesthesia Complications Negative for: history of anesthetic complications  Airway Mallampati: I  TM Distance: >3 FB Neck ROM: Full    Dental no notable dental hx.    Pulmonary neg sleep apnea, neg COPD, Current Smoker,    breath sounds clear to auscultation- rhonchi (-) wheezing      Cardiovascular hypertension, Pt. on medications (-) CAD, (-) Past MI, (-) Cardiac Stents and (-) CABG  Rhythm:Regular Rate:Normal - Systolic murmurs and - Diastolic murmurs    Neuro/Psych  Headaches, negative psych ROS   GI/Hepatic Neg liver ROS, PUD, GERD  ,  Endo/Other  negative endocrine ROSneg diabetes  Renal/GU negative Renal ROS     Musculoskeletal negative musculoskeletal ROS (+)   Abdominal (+) - obese,   Peds  Hematology negative hematology ROS (+)   Anesthesia Other Findings Past Medical History: 06/24/2015: History of chicken pox     Comment:  DID have Chicken Pox. DID have Measles. DID have Mumps.  No date: Hyperlipidemia No date: Hypertension 07/09/2015: Personal history of tobacco use, presenting hazards to  health   Reproductive/Obstetrics                            Anesthesia Physical Anesthesia Plan  ASA: II  Anesthesia Plan: General   Post-op Pain Management:    Induction: Intravenous  PONV Risk Score and Plan: 0 and Propofol infusion  Airway Management Planned: Natural Airway  Additional Equipment:   Intra-op Plan:   Post-operative Plan:   Informed Consent: I have reviewed the patients History and Physical, chart, labs and discussed the procedure including the risks, benefits and alternatives for the proposed anesthesia with the patient or authorized representative who has  indicated his/her understanding and acceptance.   Dental advisory given  Plan Discussed with: CRNA and Anesthesiologist  Anesthesia Plan Comments:         Anesthesia Quick Evaluation

## 2018-07-12 ENCOUNTER — Telehealth: Payer: Self-pay

## 2018-07-12 ENCOUNTER — Other Ambulatory Visit: Payer: Self-pay

## 2018-07-12 ENCOUNTER — Encounter: Payer: Self-pay | Admitting: Gastroenterology

## 2018-07-12 DIAGNOSIS — D126 Benign neoplasm of colon, unspecified: Secondary | ICD-10-CM

## 2018-07-12 DIAGNOSIS — Z86010 Personal history of colonic polyps: Secondary | ICD-10-CM

## 2018-07-12 DIAGNOSIS — Z1509 Genetic susceptibility to other malignant neoplasm: Secondary | ICD-10-CM

## 2018-07-12 DIAGNOSIS — Z1211 Encounter for screening for malignant neoplasm of colon: Secondary | ICD-10-CM

## 2018-07-12 NOTE — Telephone Encounter (Signed)
Appt with Dr Rush Landmark on 08/17/18 at 1:45 pm and also an urgent referral made to genetics.    Brad Myers see above note thanks

## 2018-07-12 NOTE — Telephone Encounter (Signed)
Spoke with pt regarding colonoscopy results and Dr. Georgeann Oppenheim instructions to refer pt to Dr. [place] Landmark for removal of remaining polyp and Oncology for genetic testing. Pt is aware he will be contacted for scheduling. Referrals have been sent.

## 2018-07-12 NOTE — Telephone Encounter (Signed)
-----   Message from Irving Copas., MD sent at 07/12/2018  3:17 AM EDT ----- Vicente Males, Thanks for the Inbox. Quite a few polyps for sure. We can definitely give it a try and see if we can submucosally inject the appendical orifice and see if we can outline if it is moving into the appendiceal orifice more than we can tell.  If it looks like it lifts we can try and probably give him antibiotics but if it looks like it is going in further, I would stop and having him see surgery. I think expediting the referral for Genetics is important, because if he does have an Attenuated FAP he may actually just do better with a colonic resection. [name] Sicilian, can you work with Sherald Hess and get patient in for a clinic visit to discuss and put him down for an EMR in November which would give [place] some time to get his Genetics workup completed? Thanks. Gabe ----- Message ----- From: [name], MD Sent: 07/11/2018   2:07 PM EDT To: Irving Copas., MD, #  Sherald Hess  Please refer to Dr Rush Landmark to excise  Medium sized semi sessile polyp in the appendicular orifice.   Refer him to oncology for genetic testing to R/o attenuated FAP due to 19 polyps excised today   C/c Dr Nicholes Mango

## 2018-07-13 LAB — SURGICAL PATHOLOGY

## 2018-07-16 ENCOUNTER — Telehealth: Payer: Self-pay

## 2018-07-16 NOTE — Telephone Encounter (Signed)
-----   Message from [name], MD sent at 07/15/2018  7:20 PM EDT ----- Brad Myers  Inform all polyps taken out were pre cancerous- continue with plan that is in place : referral to Dr Everardo Pacific and oncology for genetic testing and resection of a complex polyp.   C/c [name], MD   Dr [name] MD,MRCP [facility]) Gastroenterology/Hepatology Pager: [phone]

## 2018-07-16 NOTE — Telephone Encounter (Signed)
Spoke with pt and informed him of biopsy results and Dr. Georgeann Oppenheim instructions to continue with plan of referrals previously discussed. Pt agrees.

## 2018-08-13 ENCOUNTER — Other Ambulatory Visit: Payer: Self-pay | Admitting: Family Medicine

## 2018-08-13 DIAGNOSIS — G4700 Insomnia, unspecified: Secondary | ICD-10-CM

## 2018-08-13 DIAGNOSIS — I10 Essential (primary) hypertension: Secondary | ICD-10-CM

## 2018-08-17 ENCOUNTER — Ambulatory Visit: Payer: Medicare Other | Admitting: Gastroenterology

## 2018-09-12 ENCOUNTER — Ambulatory Visit (INDEPENDENT_AMBULATORY_CARE_PROVIDER_SITE_OTHER): Payer: Medicare Other | Admitting: Gastroenterology

## 2018-09-12 VITALS — BP 112/78 | HR 86 | Ht 65.0 in | Wt 128.4 lb

## 2018-09-12 DIAGNOSIS — Z86010 Personal history of colonic polyps: Secondary | ICD-10-CM | POA: Diagnosis not present

## 2018-09-12 DIAGNOSIS — D120 Benign neoplasm of cecum: Secondary | ICD-10-CM | POA: Diagnosis not present

## 2018-09-12 DIAGNOSIS — K635 Polyp of colon: Secondary | ICD-10-CM

## 2018-09-12 NOTE — Progress Notes (Signed)
[place] VISIT   Primary Care Provider [name], MD [address] [place] [address] [phone]  Referring Provider [name], MD [address] [place],  [address] [phone]  Patient Profile: Brad Myers is a 72 y.o. male with a pmh significant for hypertension, hyperlipidemia, tobacco use, colon polyps.  The patient presents to the [facility] for an evaluation and management of problem(s) noted below:  Problem List 1. Cecal polyp   2. History of adenomatous polyp of colon     History of Present Illness: This is the patient's first visit to the outpatient of our GI clinic.  He was referred for consideration of advanced polypectomy in the setting of a cecal polyp extending into the appendiceal orifice.  The patient underwent a surveillance colonoscopy in October of this year with finding of multiple colon polyps that were removed via cold snare polypectomy, hot snare polypectomy, mucosal resection, and with finding of a medium polyp at the appendiceal orifice.  The patient has not had a prior appendectomy.  Patient has no significant GI symptoms.  GI Review of Systems Positive as above including intentional weight loss Negative for pyrosis, dysphagia, odynophagia, change in bowel habits, melena, hematochezia  Review of Systems General: Denies fevers/chills HEENT: Denies oral lesions Cardiovascular: Denies chest pain Pulmonary: Denies shortness of breath Gastroenterological: See HPI Genitourinary: Denies darkened urine Hematological: Denies easy bruising/bleeding Dermatological: Denies jaundice Psychological: Mood is stable   Medications Current Outpatient Medications  Medication Sig Dispense Refill  . atorvastatin (LIPITOR) 40 MG tablet TAKE 1 TABLET(40 MG) BY MOUTH DAILY 30 tablet 12  . Cholecalciferol (VITAMIN D3) 1000 units CAPS Take by mouth daily.    Marland Kitchen NAPROXEN PO  Take by mouth at bedtime. One oral    . traZODone (DESYREL) 50 MG tablet TAKE 1 TABLET BY MOUTH EVERY NIGHT AT BEDTIME 90 tablet 5  . triamterene-hydrochlorothiazide (DYAZIDE) 37.5-25 MG capsule TAKE ONE CAPSULE BY MOUTH EVERY DAY 90 capsule 3   No current facility-administered medications for this visit.     Allergies No Known Allergies  Histories Past Medical History:  Diagnosis Date  . History of chicken pox 06/24/2015   DID have Chicken Pox. DID have Measles. DID have Mumps.    . Hyperlipidemia   . Hypertension   . Personal history of tobacco use, presenting hazards to health 07/09/2015   Past Surgical History:  Procedure Laterality Date  . COLONOSCOPY WITH PROPOFOL N/A 07/11/2018   Procedure: COLONOSCOPY WITH PROPOFOL;  Surgeon: [name], MD;  Location: [facility] ENDOSCOPY;  Service: Gastroenterology;  Laterality: N/A;  . EYE SURGERY Right 2003  . HERNIA REPAIR Right 2005   Social History   Socioeconomic History  . Marital status: Divorced    Spouse name: Not on file  . Number of children: 1  . Years of education: Not on file  . Highest education level: Some college, no degree  Occupational History  . Occupation: retired  Scientific laboratory technician  . Financial resource strain: Not hard at all  . Food insecurity:    Worry: Never true    Inability: Never true  . Transportation needs:    Medical: No    Non-medical: No  Tobacco Use  . Smoking status: Current Every Day Smoker    Packs/day: 0.25    Years: 55.00    Pack years: 13.75    Types: Cigarettes  Substance and Sexual Activity  . Alcohol use: Yes    Alcohol/week:  12.0 standard drinks    Types: 12 Cans of beer per week  . Drug use: No  . Sexual activity: Not on file  Lifestyle  . Physical activity:    Days per week: Not on file    Minutes per session: Not on file  . Stress: Not at all  Relationships  . Social connections:    Talks on phone: Not on file    Gets together: Not on file    Attends religious service: Not  on file    Active member of club or organization: Not on file    Attends meetings of clubs or organizations: Not on file    Relationship status: Not on file  . Intimate partner violence:    Fear of current or ex partner: Not on file    Emotionally abused: Not on file    Physically abused: Not on file    Forced sexual activity: Not on file  Other Topics Concern  . Not on file  Social History Narrative  . Not on file   Family History  Problem Relation Age of Onset  . Diabetes Father   . Coronary artery disease Father   . Colon cancer Neg Hx   . Esophageal cancer Neg Hx   . Inflammatory bowel disease Neg Hx   . Liver disease Neg Hx   . Pancreatic cancer Neg Hx   . Rectal cancer Neg Hx   . Stomach cancer Neg Hx   I have reviewed his medical, social, and family history in detail and updated the electronic medical record as necessary.    PHYSICAL EXAMINATION  BP 112/78   Pulse 86   Ht 5\' 5"  (1.651 m)   Wt 128 lb 6 oz (58.2 kg)   BMI 21.36 kg/m   Wt Readings from Last 3 Encounters:  09/12/18 128 lb 6 oz (58.2 kg)  07/11/18 133 lb (60.3 kg)  06/15/18 133 lb (60.3 kg)  GEN: NAD, appears stated age, doesn't appear chronically ill PSYCH: Cooperative, without pressured speech EYE: Conjunctivae pink, sclerae anicteric ENT: MMM, without oral ulcers CV: RR without R/Gs  RESP: CTAB posteriorly, without wheezing GI: NABS, soft, NT/ND, without rebound or guarding, no HSM appreciated MSK/EXT: No lower extremity edema SKIN: No jaundice NEURO:  Alert & Oriented x 3, no focal deficits   REVIEW OF DATA  I reviewed the following data at the time of this encounter:  GI Procedures and Studies  October 2019 colonoscopy - One 6 mm polyp in the cecum, removed with a cold snare. Resected and retrieved. - Six 5 to 8 mm polyps in the ascending colon, removed with a cold snare. Resected and retrieved. - Two 10 to 14 mm polyps in the transverse colon and in the ascending colon, removed with  mucosal resection. Resected and retrieved. - Five 6 to 8 mm polyps in the transverse colon, removed with a cold snare. Resected and retrieved. - One 6 mm polyp in the descending colon, removed with a cold snare. Resected and retrieved. - One 10 mm polyp in the transverse colon, removed with mucosal resection. Resected and retrieved. - One 12 mm polyp in the sigmoid colon, removed with a hot snare. Resected and retrieved. Clip was placed. - Two 3 to 5 mm polyps in the sigmoid colon, removed with a cold biopsy forceps. Resected and retrieved. - One 5 mm polyp in the sigmoid colon, removed with a cold snare. Resected and retrieved. - One medium polyp at the appendiceal orifice. -  The examination was otherwise normal on direct and retroflexion views. - Mucosal resection was performed. Resection and retrieval were complete. - Mucosal resection was performed. Resection and retrieval were complete. - Procedure took 32 minutes much longer than usual 15 minutes due to large number of polyps, multiple modalities of resections  Laboratory Studies  Reviewed in epic  Imaging Studies  No relevant studies to review   ASSESSMENT  Brad Myers is a 72 y.o. male with a pmh significant for hypertension, hyperlipidemia, tobacco use, colon polyps.  The patient is seen today for evaluation and management of:  1. Cecal polyp   2. History of adenomatous polyp of colon    The patient is clinically and hemodynamically stable.  He has evidence of an appendiceal orifice that was not sampled/biopsied but present on endoscopy with findings of multiple other colon polyps that were resected.  The patient was described techniques of advanced polypectomy.  Based upon the description and endoscopic pictures I do feel that it is reasonable to pursue an Advanced Polypectomy attempt of the polyp/lesion.  We discussed some of the techniques of advanced polypectomy which include Endoscopic Mucosal Resection, OVESCO Full-Thickness  Resection, Endorotor Morcellation, and Tissue Ablation via Fulguration.  The risks and benefits of endoscopic evaluation were discussed with the patient; these include but are not limited to the risk of perforation, infection, bleeding, missed lesions, lack of diagnosis, severe illness requiring hospitalization, as well as anesthesia and sedation related illnesses.  During attempts at advanced polypectomy, the risks of bleeding and perforation/leak are increased as opposed to diagnostic and screening colonoscopies, and that was discussed with the patient as well.  I did offer, a referral to surgery as well to discuss consideration of a surgical intervention for prior to finalizing decision for attempt at endoscopic removal; the patient has deferred on this.  If, after attempt at removal of the polyp, it is found that the patient has a complication or that an invasive lesion or malignant lesion is found, or that the polyp continues to recur, the patient is aware and understands that a surgery may still be indicated/required.  In addition, with the possible need for piecemeal resection, subsequent short-interval colonoscopies for follow up and treatment of the lesion may be necessary and was also explained.  All patient questions were answered, to the best of my ability.  The patient has deferred on attempt at advanced polypectomy at this point in time and would rather have a surgical intervention to have this taken care of rather than being put through multiple potential repeat colonoscopies.  Thus at this point the patient has foregone decision at attempt of advanced polypectomy.  We will plan to place a referral to [place] surgery and I will relay this information to Dr. Vicente Males as the patient may want to have a surgical referral in [place] closer to home.  I told the patient that at any time point if he changes his mind we would be available and happy to assist with an attempt at advanced polypectomy.   PLAN    Referral to [place] surgery for consideration of ileocecectomy Will send information back to Dr. Vicente Males as he may have a preference for age surgeon in the [facility] area that the patient prefers that location if possible Happy to be of assistance in future if patient decides they would like an attempt at advanced polyp resection endoscopically  No orders of the defined types were placed in this encounter.   New Prescriptions   No medications  on file   Modified Medications   No medications on file    Planned Follow Up: No follow-ups on file.   Justice Britain, MD [facility] Office # [phone]

## 2018-09-12 NOTE — Patient Instructions (Signed)
Normal BMI (Body Mass Index- based on height and weight) is between 23 and 30. Your BMI today is Body mass index is 21.36 kg/m. Brad Myers Please consider follow up  regarding your BMI with your Primary Care Provider.  You will be contacted bt [facility] for a referral   Thank you for entrusting me with your care and choosing [facility].  Dr Rush Landmark

## 2018-09-17 DIAGNOSIS — X32XXXA Exposure to sunlight, initial encounter: Secondary | ICD-10-CM | POA: Diagnosis not present

## 2018-09-17 DIAGNOSIS — Z08 Encounter for follow-up examination after completed treatment for malignant neoplasm: Secondary | ICD-10-CM | POA: Diagnosis not present

## 2018-09-17 DIAGNOSIS — Z85828 Personal history of other malignant neoplasm of skin: Secondary | ICD-10-CM | POA: Diagnosis not present

## 2018-09-17 DIAGNOSIS — L570 Actinic keratosis: Secondary | ICD-10-CM | POA: Diagnosis not present

## 2018-09-17 DIAGNOSIS — L821 Other seborrheic keratosis: Secondary | ICD-10-CM | POA: Diagnosis not present

## 2018-09-19 ENCOUNTER — Encounter: Payer: Self-pay | Admitting: Gastroenterology

## 2018-09-19 DIAGNOSIS — K635 Polyp of colon: Secondary | ICD-10-CM

## 2018-09-19 DIAGNOSIS — D120 Benign neoplasm of cecum: Principal | ICD-10-CM

## 2018-09-19 HISTORY — DX: Polyp of colon: K63.5

## 2018-09-27 ENCOUNTER — Inpatient Hospital Stay: Payer: Medicare Other | Attending: Oncology | Admitting: Genetics

## 2018-09-27 ENCOUNTER — Encounter: Payer: Self-pay | Admitting: Genetics

## 2018-09-27 ENCOUNTER — Inpatient Hospital Stay: Payer: Medicare Other

## 2018-09-27 DIAGNOSIS — Z801 Family history of malignant neoplasm of trachea, bronchus and lung: Secondary | ICD-10-CM | POA: Diagnosis not present

## 2018-09-27 DIAGNOSIS — Z86010 Personal history of colonic polyps: Secondary | ICD-10-CM

## 2018-09-27 DIAGNOSIS — Z808 Family history of malignant neoplasm of other organs or systems: Secondary | ICD-10-CM

## 2018-09-27 NOTE — Progress Notes (Addendum)
REFERRING PROVIDER: [name], MD  PRIMARY PROVIDER:  [name], MD  PRIMARY REASON FOR VISIT:  1. History of adenomatous polyp of colon   2. Family history of brain cancer   3. Family history of lung cancer    HISTORY OF PRESENT ILLNESS:   Brad Myers, a 72 y.o. male, was seen for a [place] cancer genetics consultation at the request of Dr. Rush Landmark due to a personal history of 20-30 colon polyps.  Brad Myers presents to clinic today to discuss the possibility of a hereditary predisposition to cancer, genetic testing, and to further clarify his future cancer risks, as well as potential cancer risks for family members.   Brad Myers reports that since 52 he has always had a few polyps on colonoscopy, he reports he had colonoscopy every 3 years for a period of time.  We have a colonoscopy report from 2013 that identified a tubular adenoma.   He then had a colonoscopy in Oct 2019 that revealed 20+ polyps, most of which were adenomatous. He reports a normal upper endoscopy 6 years ago.    Past Medical History:  Diagnosis Date  . Family history of brain cancer   . Family history of lung cancer   . History of chicken pox 06/24/2015   DID have Chicken Pox. DID have Measles. DID have Mumps.    . Hyperlipidemia   . Hypertension   . Personal history of tobacco use, presenting hazards to health 07/09/2015    Past Surgical History:  Procedure Laterality Date  . COLONOSCOPY WITH PROPOFOL N/A 07/11/2018   Procedure: COLONOSCOPY WITH PROPOFOL;  Surgeon: [name], MD;  Location: [facility] ENDOSCOPY;  Service: Gastroenterology;  Laterality: N/A;  . EYE SURGERY Right 2003  . HERNIA REPAIR Right 2005    Social History   Socioeconomic History  . Marital status: Divorced    Spouse name: Not on file  . Number of children: 1  . Years of education: Not on file  . Highest education level: Some college, no degree  Occupational History  . Occupation: retired  Scientific laboratory technician  . Financial resource  strain: Not hard at all  . Food insecurity:    Worry: Never true    Inability: Never true  . Transportation needs:    Medical: No    Non-medical: No  Tobacco Use  . Smoking status: Current Every Day Smoker    Packs/day: 0.25    Years: 55.00    Pack years: 13.75    Types: Cigarettes  Substance and Sexual Activity  . Alcohol use: Yes    Alcohol/week: 12.0 standard drinks    Types: 12 Cans of beer per week  . Drug use: No  . Sexual activity: Not on file  Lifestyle  . Physical activity:    Days per week: Not on file    Minutes per session: Not on file  . Stress: Not at all  Relationships  . Social connections:    Talks on phone: Not on file    Gets together: Not on file    Attends religious service: Not on file    Active member of club or organization: Not on file    Attends meetings of clubs or organizations: Not on file    Relationship status: Not on file  Other Topics Concern  . Not on file  Social History Narrative  . Not on file     FAMILY HISTORY:  We obtained a detailed, 4-generation family history.  Significant diagnoses are  listed below: Family History  Problem Relation Age of Onset  . Lung cancer Mother        dx 33's, died 94. unk if 2 primary or met  . Brain cancer Mother        dx 34's  . Diabetes Father   . Coronary artery disease Father   . Colon cancer Neg Hx   . Esophageal cancer Neg Hx   . Inflammatory bowel disease Neg Hx   . Liver disease Neg Hx   . Pancreatic cancer Neg Hx   . Rectal cancer Neg Hx   . Stomach cancer Neg Hx     Brad Myers has 1 daughter who is 44.  She has not had a colonoscopy yet, but he has recently talked to her about doing this soon.  He has 3 grandchildren.  Brad Myers has 1 maternal half-sister and 1 full sister.  He also has a twin brother (does not know if identical or fraternal), and other 2 full brothers.  No history of cancer reported in these relatives.  His twin brother has had a few polyps, but 'nothing excessive'.    Brad Myers's father: died at 34 due to heart attack.  Paternal Aunts/Uncles: 2 maternal aunts, no hx of cancer reported.   Paternal cousins: No hx of cancer known in pat cousins.  Paternal grandfather: unk Paternal grandmother:unk  Brad Myers mother: died at 74.  She had lung cancer and brain cancer- pt does not know if these were 2 primaries or one was a met.  Maternal Aunts/Uncles: 3 maternal aunts and 1 maternal uncle with no hx of cancer.  Maternal cousins: no hx of cancer.  Maternal grandfather: unk Maternal grandmother:unk  He does not think his parents ever had a colonoscopy.    Brad Myers is unaware of previous family history of genetic testing for hereditary cancer risks. Patient's maternal ancestors are of N. European descent, and paternal ancestors are of N. European descent. There is no reported Ashkenazi Jewish ancestry. There is no known consanguinity.  GENETIC COUNSELING ASSESSMENT: Brad Myers is a 72 y.o. male with a personal which is somewhat suggestive of a Hereditary Cancer Predisposition Syndrome. We, therefore, discussed and recommended the following at today's visit.   DISCUSSION: We reviewed the characteristics, features and inheritance patterns of hereditary cancer syndromes. We also discussed genetic testing, including the appropriate family members to test, the process of testing, insurance coverage and turn-around-time for results. We discussed the implications of a negative, positive and/or variant of uncertain significant result. We recommended Brad Myers pursue genetic testing for the Common Hereditary Cancers panel + brain cancer gene panel + EGFR.   The Common Hereditary Cancer Panel offered by [facility] includes sequencing and/or deletion duplication testing of the following 35 genes: APC, ATM, AXIN2, BARD1, BMPR1A, BRCA1, BRCA2, BRIP1, BUB1B, CDH1, CDK4, CDKN2A, CHEK2, CTNNA1, DICER1, ENG, EPCAM, GALNT12, GREM1, HOXB13, KIT, MEN1, MLH1, MLH3, MSH2, MSH3, MSH6,  MUTYH, NBN, NF1, NTHL1, PALB2, PDGFRA, PMS2, POLD1, POLE, PTEN, RAD50, RAD51C, RAD51D, RNF43, RPS20, SDHA, SDHB, SDHC, SDHD, SMAD4, SMARCA4, STK11, TP53, TSC1, TSC2, VHL  Brain/Nervous System Cancer Panel: AIP ALK APC DICER1 EPCAM HRAS LZTR1 MEN1 MLH1 MSH2 MSH6 NF1 NF2 PHOX2B PMS2 PRKAR1A PTCH1 PTEN RB1 SMARCA4 SMARCB1 SMARCE1 SUFU TP53 TSC1 TSC2 VHL   When an individual develops multiple adenomatous colon polyps, there is concern for a condition called Familial Adenomatous Polyposis (FAP).   In the classic form of FAP, people generally have hundreds to thousands of adenomatous  polyps.  A milder version of FAP called Attenuated FAP (AFAP) is characterized by less than 100 adenomatous polyps.   There are two genes that have been associated with FAP/AFAP and each has a different pattern of inheritance.  The first gene is called APC and is inherited in an autosomal dominant fashion.  In this case, having only one alteration (mutation) in one copy of the APC gene causes polyps to develop.  In about 30% of cases caused by APC, the condition is diagnosed for the first time in a family where both parents do not have the condition.  In other words, there are 30% of people with FAP/AFAP where the mutation occurred in them for the first time.     Colon polyposis can also be caused by mutations in the MUTYH gene, which causes a condition known as MUTYH-associated polyposis.  This is an autosomal recessive genetic condition.  In this case, an individual needs to have inherited a mutation in each copy of the MYH gene, one from each parent, in order to develop polyposis.  Carrying just one mutation of the MYH gene does not typically cause any problems or place an individual at risk for cancer.    Another syndrome we were concerned about in your family is called Lynch Syndrome, also called HNPCC (Hereditary Non-Polyposis Colon Cancer).  This syndrome increases the risk for colon, uterine, ovarian and stomach cancers,  brain cancers, as well as others.  Families with Lynch Syndrome tend to have multiple family members with these cancers, typically diagnosed under age 52, and diagnoses in multiple generations. The genes that are known to cause Lynch Syndrome are called MLH1, MSH2, MSH6, PMS2 and EPCAM.     Some families that appear to have any of these conditions will have normal testing of these genes.  In those cases it is possible that the large amount of colon polyps may be causes by a syndrome called Serrated Polyposis Syndrome.  This condition causes an abnormally large amount of serrate polyps to develop and believe to be hereditary.  However, the genetic cause of this syndrome has not yet been identified.     We discussed that if he is found to have a mutation in one of these genes, it may impact future medical management recommendations such as increased cancer screenings and consideration of risk reducing surgeries.  A positive result could also have implications for the patient's family members.   A Negative result would mean we were unable to identify a hereditary component to his development of adenomatous polyps, but does not rule out the possibility of a hereditary basis for his polyps. There could be mutations that are undetectable by current technology, or in genes not yet tested or identified to increase cancer risk.     We discussed the potential to find a Variant of Uncertain Significance or VUS.  These are variants that have not yet been identified as pathogenic or benign, and it is unknown if this variant is associated with increased cancer risk or if this is a normal finding.  Most VUS's are reclassified to benign or likely benign.   It should not be used to make medical management decisions. With time, we suspect the lab will determine the significance of any VUS's identified if any.   Based on Brad Myers personal history of colon polyps, he meets medical criteria for genetic testing. Despite that  he meets criteria, he may still have an out of pocket cost. The laboratory can provide  him with an estimate of his OOP cost.  hospital was provided the contact information for the laboratory if hospital has further questions.   PLAN: After considering the risks, benefits, and limitations, Brad Myers  provided informed consent to pursue genetic testing and the blood sample was sent to [facility] for analysis of the Common Hereditary Cancers Panel + Brain/Nervous System Panel + EGFR. Results should be available within approximately 2-3 weeks' time, at which point they will be disclosed by telephone to Brad Myers, as will any additional recommendations warranted by these results. Brad Myers will receive a summary of his genetic counseling visit and a copy of his results once available. This information will also be available in Epic. We encouraged Brad Myers to remain in contact with cancer genetics annually so that we can continuously update the family history and inform him of any changes in cancer genetics and testing that may be of benefit for his family. Brad Myers's questions were answered to his satisfaction today. Our contact information was provided should additional questions or concerns arise.  Lastly, we encouraged Mr. Dowson to remain in contact with cancer genetics annually so that we can continuously update the family history and inform him of any changes in cancer genetics and testing that may be of benefit for this family.   Mr.  Nickey's questions were answered to his satisfaction today. Our contact information was provided should additional questions or concerns arise. Thank you for the referral and allowing [place] to share in the care of your patient.   Tana Felts, Ms, [facility] Certified Genetic Counselor [email or web address] phone: [phone]  The patient was seen for a total of 35 minutes in face-to-face genetic counseling.

## 2018-10-17 ENCOUNTER — Telehealth: Payer: Self-pay | Admitting: Genetics

## 2018-10-17 NOTE — Telephone Encounter (Signed)
Revealed negative genetic testing.  Revealed that a VUS in SDHA was identified.   This normal result means we did not identify a  hereditary cause for his colon polyps. However, genetic testing is not perfect, and cannot definitively rule out a hereditary cause.  It will be important for him to keep in contact with genetics to learn if any additional testing may be needed in the future.     We recommended he continue to follow- his healthcare providers' recommendations regarding his colon polyposis and cancer screening in general.  We discussed that even when we do not identify a genetic mutation, colon polyps can still run in families and encouraged all of his relatives to discuss this family history with their doctors as they may  Need earlier and more frequent colonoscopies.

## 2018-10-18 DIAGNOSIS — H2513 Age-related nuclear cataract, bilateral: Secondary | ICD-10-CM | POA: Diagnosis not present

## 2018-10-19 ENCOUNTER — Encounter: Payer: Self-pay | Admitting: Genetics

## 2018-10-19 ENCOUNTER — Ambulatory Visit: Payer: Self-pay | Admitting: Genetics

## 2018-10-19 DIAGNOSIS — Z87891 Personal history of nicotine dependence: Secondary | ICD-10-CM

## 2018-10-19 DIAGNOSIS — Z808 Family history of malignant neoplasm of other organs or systems: Secondary | ICD-10-CM

## 2018-10-19 DIAGNOSIS — Z1379 Encounter for other screening for genetic and chromosomal anomalies: Secondary | ICD-10-CM

## 2018-10-19 DIAGNOSIS — Z801 Family history of malignant neoplasm of trachea, bronchus and lung: Secondary | ICD-10-CM

## 2018-10-19 DIAGNOSIS — Z86010 Personal history of colonic polyps: Secondary | ICD-10-CM

## 2018-10-19 NOTE — Progress Notes (Signed)
HPI:  [name] was previously seen in the [place] clinic on 09/27/2018 due to a personal history of colon polyps and concerns regarding a hereditary predisposition to cancer. Please refer to our prior cancer genetics clinic note for more information regarding [name] medical, social and family histories, and our assessment and recommendations, at the time. Brad Myers recent genetic test results were disclosed to him, as well as recommendations warranted by these results. These results and recommendations are discussed in more detail below.  CANCER HISTORY:  Brad Myers reports that since 56 he has always had a few polyps on colonoscopy, he reports he had colonoscopy every 3 years for a period of time.  We have a colonoscopy report from 2013 that identified a tubular adenoma.   He then had a colonoscopy in Oct 2019 that revealed 20+ polyps, most of which were adenomatous. He reports a normal upper endoscopy 6 years ago.    FAMILY HISTORY:  We obtained a detailed, 4-generation family history.  Significant diagnoses are listed below: Family History  Problem Relation Age of Onset  . Lung cancer Mother        dx 35's, died 42. unk if 2 primary or met  . Brain cancer Mother        dx 22's  . Diabetes Father   . Coronary artery disease Father   . Colon cancer Neg Hx   . Esophageal cancer Neg Hx   . Inflammatory bowel disease Neg Hx   . Liver disease Neg Hx   . Pancreatic cancer Neg Hx   . Rectal cancer Neg Hx   . Stomach cancer Neg Hx     Brad Myers has 1 daughter who is 59.  She has not had a colonoscopy yet, but he has recently talked to her about doing this soon.  He has 3 grandchildren.  Brad Myers has 1 maternal half-sister and 1 full sister.  He also has a twin brother (does not know if identical or fraternal), and other 2 full brothers.  No history of cancer reported in these relatives.  His twin brother has had a few polyps, but 'nothing excessive'.   [name]'s father:  died at 5 due to heart attack.  Paternal Aunts/Uncles: 2 maternal aunts, no hx of cancer reported.   Paternal cousins: No hx of cancer known in pat cousins.  Paternal grandfather: unk Paternal grandmother:unk  [name] mother: died at 55.  She had lung cancer and brain cancer- pt does not know if these were 2 primaries or one was a met.  Maternal Aunts/Uncles: 3 maternal aunts and 1 maternal uncle with no hx of cancer.  Maternal cousins: no hx of cancer.  Maternal grandfather: unk Maternal grandmother:unk  He does not think his parents ever had a colonoscopy.    [name] is unaware of previous family history of genetic testing for hereditary cancer risks. Patient's maternal ancestors are of N. European descent, and paternal ancestors are of N. European descent. There is no reported Ashkenazi Jewish ancestry. There is no known consanguinity.  GENETIC TEST RESULTS: Genetic testing performed through [facility]'s Common Hereditary Cancers Panel + Brain/Nervous System panel reported out on 10/05/2018 showed no pathogenic mutations.  Genes analyzed AIP, ALK, APC, ATM, AXIN2, BARD1, BMPR1A, BRCA1, BRCA2, BRIP1, BUB1B, CDH1, CDK4, CDKN2A (p14ARF), CDKN2A (p16INK4a), CHEK2, CTNNA1, DICER1, EGFR, ENG, EPCAM*, GALNT12, GREM1*, HOXB13, HRAS, KIT, LZTR1, MEN1, MLH1, MLH3, MSH2, MSH3, MSH6, MUTYH, NBN, NF1, NF2, NTHL1, PALB2, PDGFRA, PHOX2B*, PMS2,  POLD1, POLE, PRKAR1A, PTCH1, PTEN, RAD50, RAD51C, RAD51D, RB1, RNF43, RPS20, SDHA*, SDHB, SDHC, SDHD, SMAD4, SMARCA4, SMARCB1, SMARCE1, STK11, SUFU, TP53, TSC1, TSC2, VHL.  A variant of uncertain significance (VUS) in a gene called SDHA was also noted. [id number] ([id number]).  The test report will be scanned into EPIC and will be located under the Molecular Pathology section of the Results Review tab. A portion of the result report is included below for reference.     We discussed with [name] that because current genetic testing is not perfect, it is  possible there may be a gene mutation in one of these genes that current testing cannot detect, but that chance is small.  We also discussed, that there could be another gene that has not yet been discovered, or that we have not yet tested, that is responsible for the cancer diagnoses in the family. It is also possible there is a hereditary cause for the cancer in the family that [name] did not inherit and therefore was not identified in his testing.  Therefore, it is important to remain in touch with cancer genetics in the future so that we can continue to offer [name] the most up to date genetic testing.   Regarding the VUS in SDHA: At this time, it is unknown if this variant is associated with increased cancer risk or if this is a normal finding, but most variants such as this get reclassified to being inconsequential. It should not be used to make medical management decisions. With time, we suspect the lab will determine the significance of this variant, if any. If we do learn more about it, we will try to contact [name] to discuss it further. However, it is important to stay in touch with [place] periodically and keep the address and phone number up to date.  ADDITIONAL GENETIC TESTING: We discussed with [name] that his genetic testing was fairly extensive.  If there are are genes identified to increase cancer risk that can be analyzed in the future, we would be happy to discuss and coordinate this testing at that time.    CANCER SCREENING RECOMMENDATIONS: This negative result means that we were unable to identify a hereditary cause for his personal history of colon polyps at  this time.  This result does not rule out a hereditary cause for his  cancer.  It is still possible that there could be genetic mutations that are undetectable by current technology, or genetic mutations in genes that have not been tested or identified to increase cancer risk.  Therefore, it is recommended he continue to  follow the cancer management and screening guidelines provided by his oncology and primary healthcare provider. An individual's cancer risk is not determined by genetic test results alone.  Overall cancer risk assessment includes additional factors such as personal medical history, family history, etc.  These should be used to make a personalized plan for cancer prevention and surveillance.    Given his polyp history, it is important that he continue to follow his GI providers' recommendations regarding screening and management of his polyposis.   Some families that appear to have any of these conditionswill have normal testing of these genes.In those cases it is possible that the large amount of colon polyps may be causes by a syndrome called Serrated Polyposis Syndrome. This condition causes an abnormally large amount of serrate polyps to develop and believe to be hereditary. However, the genetic cause of this syndrome has not yet been  identified.   RECOMMENDATIONS FOR FAMILY MEMBERS:  Relatives in this family might be at some increased risk of developing cancer, over the general population risk, simply due to the family history of cancer.  We recommended women in this family have a yearly mammogram beginning at age 2, or 70 years younger than the earliest onset of cancer, an annual clinical breast exam, and perform monthly breast self-exams. Women in this family should also have a gynecological exam as recommended by their primary provider. All family members should have a colonoscopy by age 40 (or as directed by their doctors).  All family members should inform their physicians about the family history of cancer so their doctors can make the most appropriate screening recommendations for them.   We discussed that colon polyps can still run in families even when we do not identify a genetic mutation. Therefore, we recommend his daughter and siblings have colonoscopy at least every 5 years starting  at age 88 (or depending on polyp hx/doctor recommendation).   FOLLOW-UP: Lastly, we discussed with [name] that cancer genetics is a rapidly advancing field and it is possible that new genetic tests will be appropriate for him and/or his family members in the future. We encouraged him to remain in contact with cancer genetics on an annual basis so we can update his personal and family histories and let him know of advances in cancer genetics that may benefit this family.   Our contact number was provided. [name]'s questions were answered to his satisfaction, and he knows he is welcome to call us at anytime with additional questions or concerns.   Ferol Luz, MS, [facility] Certified Genetic Counselor [email or web address]

## 2018-10-23 ENCOUNTER — Telehealth: Payer: Self-pay

## 2018-10-23 NOTE — Telephone Encounter (Signed)
Spoke with pt regarding his referral to [facility]. Pt states he was not contacted to schedule an appointment. I then contacted [facility] to check on that status of pt referral and was told that they attempted to contact pt 1 month ago but pt phone was not in service. I explained this to pt and also explained that he is now required to contact [facility] to schedule his appointment. I have given pt the contact number and pt agrees to call.

## 2018-10-23 NOTE — Telephone Encounter (Signed)
-----   Message from [name], MD sent at 10/23/2018  8:54 AM EST ----- Brad Myers  Please  enquire if he had surgery to have his polyp excised in the colon after he saw DR Mansouraty

## 2018-10-31 DIAGNOSIS — K635 Polyp of colon: Secondary | ICD-10-CM | POA: Diagnosis not present

## 2018-11-13 ENCOUNTER — Encounter: Payer: Self-pay | Admitting: Gastroenterology

## 2018-11-13 ENCOUNTER — Telehealth: Payer: Self-pay

## 2018-11-13 NOTE — Progress Notes (Signed)
Review of outside records  10/31/2018 [facility] surgery record from Dr. Hassell Done. Patient was referred for consideration of ileocecectomy and after the discussion of procedure the patient decided to not have surgery.  It was felt that this was a potentially viable alternative as long as he was agreeable to subsequent colonoscopic surveillance.  Dr. Hassell Done will be available if he chooses to want surgery in the future.  I will have this record scanned into the chart.  I will place this documentation in the chart and send a an inbox message to Dr. Vicente Males as well so that he is aware of the patient's decision.  Patty, if the patient would like to have another discussion about [place] trying to remove this endoscopically as he had deferred on this initially because he did not want to have multiple colonoscopies and please let him know I am happy to talk with him and see if he would like to move forward with advanced polyp resection attempt.  We would see him back in clinic because of this so that we could discuss his final decision.  If he chooses not to have this advanced polyp resection then he can follow-up with Dr. Vicente Males or myself for follow-up colonoscopy within the next 1 year from his prior. This is not the normal recommendation in regards to just monitoring a polyp in someone who is otherwise a healthy individual however this is the patient's decision and we will respect what he wants to be done.  Justice Britain, MD [facility] Advanced Endoscopy Office # [phone]

## 2018-11-13 NOTE — Progress Notes (Signed)
The pt was called and asked if he would like to schedule colon/emr and he declines.  He states he spoke with Dr Hassell Done at [facility] and they decided it was not necessary.

## 2018-11-13 NOTE — Telephone Encounter (Signed)
Called pt to offer an office appointment with Dr. Vicente Males to discuss polyp removal.  Unable to contact LVM to return call

## 2018-11-13 NOTE — Progress Notes (Signed)
Noted chain of communication.   Sherald Hess - please inform patient to meet me in the office to discuss about his decision. I will go through his options vs risks for each and he can decide for after.   C/c [name], MD and DR Mansouraty

## 2018-11-13 NOTE — Progress Notes (Signed)
Patty, thank you for reaching out to him to see if he wanted to return for further discussions - I am sorry he has declined. I went ahead and try to talk with him as well this morning and reiterated the concern that at some point this polyp will become a cancer though we do not know at what point that would be. He has once again declined and wants to follow-up with his primary care doctor and with Dr. Vicente Males about decisions for surveillance at this point in time. This is documentation of the patient's discussion as well as his thought process as well as reasoning to decline any further therapy at this point. I will relay this message to Dr. Vicente Males so that he is aware. I will be available as necessary for the patient in the future should he decide to consider other interventions in the future.  Justice Britain, MD [facility] Office # [phone]

## 2018-12-13 ENCOUNTER — Ambulatory Visit: Payer: Self-pay | Admitting: Family Medicine

## 2018-12-13 ENCOUNTER — Ambulatory Visit: Payer: Self-pay

## 2018-12-26 ENCOUNTER — Ambulatory Visit (INDEPENDENT_AMBULATORY_CARE_PROVIDER_SITE_OTHER): Payer: Medicare Other | Admitting: Family Medicine

## 2018-12-26 ENCOUNTER — Other Ambulatory Visit: Payer: Self-pay

## 2018-12-26 ENCOUNTER — Ambulatory Visit (INDEPENDENT_AMBULATORY_CARE_PROVIDER_SITE_OTHER): Payer: Medicare Other

## 2018-12-26 ENCOUNTER — Encounter: Payer: Self-pay | Admitting: Family Medicine

## 2018-12-26 VITALS — BP 122/72 | HR 71 | Temp 97.7°F | Ht 65.0 in | Wt 131.4 lb

## 2018-12-26 VITALS — BP 122/72 | HR 71 | Temp 97.7°F | Ht 65.0 in | Wt 131.0 lb

## 2018-12-26 DIAGNOSIS — K635 Polyp of colon: Secondary | ICD-10-CM

## 2018-12-26 DIAGNOSIS — D120 Benign neoplasm of cecum: Secondary | ICD-10-CM

## 2018-12-26 DIAGNOSIS — Z0000 Encounter for general adult medical examination without abnormal findings: Secondary | ICD-10-CM | POA: Diagnosis not present

## 2018-12-26 DIAGNOSIS — Z87891 Personal history of nicotine dependence: Secondary | ICD-10-CM

## 2018-12-26 DIAGNOSIS — I10 Essential (primary) hypertension: Secondary | ICD-10-CM | POA: Diagnosis not present

## 2018-12-26 DIAGNOSIS — E785 Hyperlipidemia, unspecified: Secondary | ICD-10-CM

## 2018-12-26 NOTE — Patient Instructions (Signed)
.   Please review the attached list of medications and notify my office if there are any errors.   . Please bring all of your medications to every appointment so we can make sure that our medication list is the same as yours.    The CDC recommends two doses of Shingrix (the shingles vaccine) separated by 2 to 6 months for adults age 73 years and older. I recommend checking with your insurance plan regarding coverage for this vaccine.   

## 2018-12-26 NOTE — Patient Instructions (Addendum)
[name] , Thank you for taking time to come for your Medicare Wellness Visit. I appreciate your ongoing commitment to your health goals. Please review the following plan we discussed and let me know if I can assist you in the future.   Screening recommendations/referrals: Colonoscopy: Up to date, due 07/2023 Recommended yearly ophthalmology/optometry visit for glaucoma screening and checkup Recommended yearly dental visit for hygiene and checkup  Vaccinations: Influenza vaccine: Up to date Pneumococcal vaccine: Completed series Tdap vaccine: Up to date, due 01/2022 Shingles vaccine: Pt declines today.     Advanced directives: Currently on file.   Conditions/risks identified: Continue increasing water intake to 6-8 8 oz glasses a day and exercising 3 days a week for at least 30 minutes at a time.   Next appointment: 9:40 AM today with Dr Brad Myers.   Preventive Care 73 Years and Older, Male Preventive care refers to lifestyle choices and visits with your health care provider that can promote health and wellness. What does preventive care include?  A yearly physical exam. This is also called an annual well check.  Dental exams once or twice a year.  Routine eye exams. Ask your health care provider how often you should have your eyes checked.  Personal lifestyle choices, including:  Daily care of your teeth and gums.  Regular physical activity.  Eating a healthy diet.  Avoiding tobacco and drug use.  Limiting alcohol use.  Practicing safe sex.  Taking low doses of aspirin every day.  Taking vitamin and mineral supplements as recommended by your health care provider. What happens during an annual well check? The services and screenings done by your health care provider during your annual well check will depend on your age, overall health, lifestyle risk factors, and family history of disease. Counseling  Your health care provider may ask you questions about your:   Alcohol use.  Tobacco use.  Drug use.  Emotional well-being.  Home and relationship well-being.  Sexual activity.  Eating habits.  History of falls.  Memory and ability to understand (cognition).  Work and work Statistician. Screening  You may have the following tests or measurements:  Height, weight, and BMI.  Blood pressure.  Lipid and cholesterol levels. These may be checked every 5 years, or more frequently if you are over 60 years old.  Skin check.  Lung cancer screening. You may have this screening every year starting at age 49 if you have a 30-pack-year history of smoking and currently smoke or have quit within the past 15 years.  Fecal occult blood test (FOBT) of the stool. You may have this test every year starting at age 42.  Flexible sigmoidoscopy or colonoscopy. You may have a sigmoidoscopy every 5 years or a colonoscopy every 10 years starting at age 33.  Prostate cancer screening. Recommendations will vary depending on your family history and other risks.  Hepatitis C blood test.  Hepatitis B blood test.  Sexually transmitted disease (STD) testing.  Diabetes screening. This is done by checking your blood sugar (glucose) after you have not eaten for a while (fasting). You may have this done every 1-3 years.  Abdominal aortic aneurysm (AAA) screening. You may need this if you are a current or former smoker.  Osteoporosis. You may be screened starting at age 61 if you are at high risk. Talk with your health care provider about your test results, treatment options, and if necessary, the need for more tests. Vaccines  Your health care provider may recommend  certain vaccines, such as:  Influenza vaccine. This is recommended every year.  Tetanus, diphtheria, and acellular pertussis (Tdap, Td) vaccine. You may need a Td booster every 10 years.  Zoster vaccine. You may need this after age 84.  Pneumococcal 13-valent conjugate (PCV13) vaccine. One dose is  recommended after age 109.  Pneumococcal polysaccharide (PPSV23) vaccine. One dose is recommended after age 11. Talk to your health care provider about which screenings and vaccines you need and how often you need them. This information is not intended to replace advice given to you by your health care provider. Make sure you discuss any questions you have with your health care provider. Document Released: 10/23/2015 Document Revised: 06/15/2016 Document Reviewed: 07/28/2015 Elsevier Interactive Patient Education  2017 [place] Prevention in the Home Falls can cause injuries. They can happen to people of all ages. There are many things you can do to make your home safe and to help prevent falls. What can I do on the outside of my home?  Regularly fix the edges of walkways and driveways and fix any cracks.  Remove anything that might make you trip as you walk through a door, such as a raised step or threshold.  Trim any bushes or trees on the path to your home.  Use bright outdoor lighting.  Clear any walking paths of anything that might make someone trip, such as rocks or tools.  Regularly check to see if handrails are loose or broken. Make sure that both sides of any steps have handrails.  Any raised decks and porches should have guardrails on the edges.  Have any leaves, snow, or ice cleared regularly.  Use sand or salt on walking paths during winter.  Clean up any spills in your garage right away. This includes oil or grease spills. What can I do in the bathroom?  Use night lights.  Install grab bars by the toilet and in the tub and shower. Do not use towel bars as grab bars.  Use non-skid mats or decals in the tub or shower.  If you need to sit down in the shower, use a plastic, non-slip stool.  Keep the floor dry. Clean up any water that spills on the floor as soon as it happens.  Remove soap buildup in the tub or shower regularly.  Attach bath mats  securely with double-sided non-slip rug tape.  Do not have throw rugs and other things on the floor that can make you trip. What can I do in the bedroom?  Use night lights.  Make sure that you have a light by your bed that is easy to reach.  Do not use any sheets or blankets that are too big for your bed. They should not hang down onto the floor.  Have a firm chair that has side arms. You can use this for support while you get dressed.  Do not have throw rugs and other things on the floor that can make you trip. What can I do in the kitchen?  Clean up any spills right away.  Avoid walking on wet floors.  Keep items that you use a lot in easy-to-reach places.  If you need to reach something above you, use a strong step stool that has a grab bar.  Keep electrical cords out of the way.  Do not use floor polish or wax that makes floors slippery. If you must use wax, use non-skid floor wax.  Do not have throw rugs and other  things on the floor that can make you trip. What can I do with my stairs?  Do not leave any items on the stairs.  Make sure that there are handrails on both sides of the stairs and use them. Fix handrails that are broken or loose. Make sure that handrails are as long as the stairways.  Check any carpeting to make sure that it is firmly attached to the stairs. Fix any carpet that is loose or worn.  Avoid having throw rugs at the top or bottom of the stairs. If you do have throw rugs, attach them to the floor with carpet tape.  Make sure that you have a light switch at the top of the stairs and the bottom of the stairs. If you do not have them, ask someone to add them for you. What else can I do to help prevent falls?  Wear shoes that:  Do not have high heels.  Have rubber bottoms.  Are comfortable and fit you well.  Are closed at the toe. Do not wear sandals.  If you use a stepladder:  Make sure that it is fully opened. Do not climb a closed  stepladder.  Make sure that both sides of the stepladder are locked into place.  Ask someone to hold it for you, if possible.  Clearly mark and make sure that you can see:  Any grab bars or handrails.  First and last steps.  Where the edge of each step is.  Use tools that help you move around (mobility aids) if they are needed. These include:  Canes.  Walkers.  Scooters.  Crutches.  Turn on the lights when you go into a dark area. Replace any light bulbs as soon as they burn out.  Set up your furniture so you have a clear path. Avoid moving your furniture around.  If any of your floors are uneven, fix them.  If there are any pets around you, be aware of where they are.  Review your medicines with your doctor. Some medicines can make you feel dizzy. This can increase your chance of falling. Ask your doctor what other things that you can do to help prevent falls. This information is not intended to replace advice given to you by your health care provider. Make sure you discuss any questions you have with your health care provider. Document Released: 07/23/2009 Document Revised: 03/03/2016 Document Reviewed: 10/31/2014 Elsevier Interactive Patient Education  2017 [employer].

## 2018-12-26 NOTE — Progress Notes (Signed)
E      Patient: Brad Myers Male    DOB: 1946/09/16   73 y.o.   MRN: [id number] Visit Date: 12/26/2018  Today's Provider: Lelon Huh, MD   Chief Complaint  Patient presents with  . Hypertension  . Hyperlipidemia   Subjective:     HPI     Hypertension, follow-up:  BP Readings from Last 3 Encounters:  12/26/18 122/72  12/26/18 122/72  09/12/18 112/78    He was last seen for hypertension 6 months ago.  BP at that visit was 136/70. Management since that visit includes no changes He reports excellent compliance with treatment. He is not having side effects.  He is exercising. He is adherent to low salt diet.   Outside blood pressures are 120-130/70-80's. He is experiencing none.  Patient denies chest pain, lower extremity edema, palpitations and tachypnea.   Cardiovascular risk factors include advanced age (older than 74 for men, 27 for women), dyslipidemia, hypertension and male gender.  Use of agents associated with hypertension: none.   ------------------------------------------------------------------------    Lipid/Cholesterol, Follow-up:   Last seen for this 6 months ago.  Management since that visit includes no changes.  Last Lipid Panel:    Component Value Date/Time   CHOL 144 06/15/2018 1021   TRIG 161 (H) 06/15/2018 1021   HDL 44 06/15/2018 1021   CHOLHDL 3.3 06/15/2018 1021   LDLCALC 68 06/15/2018 1021    He reports excellent compliance with treatment. He is not having side effects.   Wt Readings from Last 3 Encounters:  12/26/18 131 lb (59.4 kg)  12/26/18 131 lb 6.4 oz (59.6 kg)  09/12/18 128 lb 6 oz (58.2 kg)    ------------------------------------------------------------------------    No Known Allergies   Current Outpatient Medications:  .  atorvastatin (LIPITOR) 40 MG tablet, TAKE 1 TABLET(40 MG) BY MOUTH DAILY, Disp: 30 tablet, Rfl: 12 .  Cholecalciferol (VITAMIN D3) 1000 units CAPS, Take by mouth daily., Disp: , Rfl:  .   NAPROXEN PO, Take by mouth at bedtime. One oral, Disp: , Rfl:  .  traZODone (DESYREL) 50 MG tablet, TAKE 1 TABLET BY MOUTH EVERY NIGHT AT BEDTIME, Disp: 90 tablet, Rfl: 5 .  triamterene-hydrochlorothiazide (DYAZIDE) 37.5-25 MG capsule, TAKE ONE CAPSULE BY MOUTH EVERY DAY, Disp: 90 capsule, Rfl: 3  Review of Systems  Constitutional: Negative.  Negative for appetite change, chills and fever.  HENT: Negative.   Eyes: Negative.   Respiratory: Negative.  Negative for chest tightness, shortness of breath and wheezing.   Cardiovascular: Negative.  Negative for chest pain and palpitations.  Gastrointestinal: Negative.  Negative for abdominal pain, nausea and vomiting.  Endocrine: Negative.   Genitourinary: Negative.   Musculoskeletal: Negative.   Skin: Negative.   Allergic/Immunologic: Negative.   Neurological: Negative.   Hematological: Negative.   Psychiatric/Behavioral: Negative.     Social History   Tobacco Use  . Smoking status: Current Every Day Smoker    Packs/day: 0.25    Types: Cigarettes  . Smokeless tobacco: Former Systems developer    Types: Snuff, Chew  . Tobacco comment: smoked up to 1/2 ppd since 73yo  Substance Use Topics  . Alcohol use: Yes    Alcohol/week: 12.0 standard drinks    Types: 12 Cans of beer per week      Objective:   BP 122/72   Pulse 71   Temp 97.7 F (36.5 C) (Oral)   Ht 5\' 5"  (1.651 m)   Wt 131 lb (59.4 kg)   BMI  21.80 kg/m  Vitals:   12/26/18 0918  BP: 122/72  Pulse: 71  Temp: 97.7 F (36.5 C)  TempSrc: Oral  Weight: 131 lb (59.4 kg)  Height: 5\' 5"  (1.651 m)     Physical Exam   General Appearance:    Alert, cooperative, no distress  Eyes:    PERRL, conjunctiva/corneas clear, EOM's intact       Lungs:     Clear to auscultation bilaterally, respirations unlabored  Heart:    Regular rate and rhythm  Neurologic:   Awake, alert, oriented x 3. No apparent focal neurological           defect.           Assessment & Plan     1. Essential  (primary) hypertension Well controlled.  Continue current medications.   - EKG 12-Lead  2. Hyperlipidemia, unspecified hyperlipidemia type He is tolerating atorvastatin well with no adverse effects.    3. Personal history of tobacco use, presenting hazards to health Strongly encourage to stop smoking. Is getting annual LDCT screening  4. Cecal polyp Seen by Dr. Rush Landmark 09/12/2018 and patient elected not to have advanced polyp resection and referred to [facility] to consider surgical resection. Was seen by Dr. Hassell Done at [facility] on 10/31/2018 and elected not to proceed with surgery and continue surveillance with colonoscopy. He is not schedule for any follow up at this time    Lelon Huh, MD  [place]

## 2018-12-26 NOTE — Progress Notes (Signed)
Subjective:   Brad Myers is a 73 y.o. male who presents for Medicare Annual/Subsequent preventive examination.  Review of Systems:  N/A  Cardiac Risk Factors include: advanced age (>53mn, >>76women);dyslipidemia;hypertension;male gender;smoking/ tobacco exposure;sedentary lifestyle     Objective:    Vitals: BP 122/72 (BP Location: Right Arm)   Pulse 71   Temp 97.7 F (36.5 C) (Oral)   Ht 5' 5"  (1.651 m)   Wt 131 lb 6.4 oz (59.6 kg)   BMI 21.87 kg/m   Body mass index is 21.87 kg/m.  Advanced Directives 12/26/2018 07/11/2018 12/11/2017 11/29/2016  Does Patient Have a Medical Advance Directive? Yes No Yes Yes  Type of AParamedicof ASouth Patrick ShoresLiving will - HOakleaf PlantationLiving will HStarLiving will  Copy of [place] Chart? Yes - validated most recent copy scanned in chart (See row information) - Yes No - copy requested    Tobacco Social History   Tobacco Use  Smoking Status Current Every Day Smoker  . Packs/day: 0.25  . Years: 55.00  . Pack years: 13.75  . Types: Cigarettes  Smokeless Tobacco Former USystems developer . Types: Snuff, Chew     Ready to quit: No Counseling given: No   Clinical Intake:  Pre-visit preparation completed: Yes  Pain : No/denies pain Pain Score: 0-No pain     Nutritional Status: BMI of 19-24  Normal Nutritional Risks: None Diabetes: No  How often do you need to have someone help you when you read instructions, pamphlets, or other written materials from your doctor or pharmacy?: 1 - Never  Interpreter Needed?: No  Information [facility] :: [facility]  Past Medical History:  Diagnosis Date  . Family history of brain cancer   . Family history of lung cancer   . History of chicken pox 06/24/2015   DID have Chicken Pox. DID have Measles. DID have Mumps.    . Hyperlipidemia   . Hypertension   . Personal history of tobacco use, presenting hazards to health  07/09/2015   Past Surgical History:  Procedure Laterality Date  . COLONOSCOPY WITH PROPOFOL N/A 07/11/2018   Procedure: COLONOSCOPY WITH PROPOFOL;  Surgeon: AJonathon Bellows MD;  [facility];  Service: Gastroenterology;  Laterality: N/A;  . EYE SURGERY Right 2003  . HERNIA REPAIR Right 2005   Family History  Problem Relation Age of Onset  . Lung cancer Mother        dx 624's died 671 unk if 2 primary or met  . Brain cancer Mother        dx 676's . Diabetes Father   . Coronary artery disease Father   . Colon cancer Neg Hx   . Esophageal cancer Neg Hx   . Inflammatory bowel disease Neg Hx   . Liver disease Neg Hx   . Pancreatic cancer Neg Hx   . Rectal cancer Neg Hx   . Stomach cancer Neg Hx    Social History   Socioeconomic History  . Marital status: Divorced    Spouse name: Not on file  . Number of children: 1  . Years of education: Not on file  . Highest education level: Some college, no degree  Occupational History  . Occupation: retired  SScientific laboratory technician . Financial resource strain: Not hard at all  . Food insecurity:    Worry: Never true    Inability: Never true  . Transportation needs:    Medical: No  Non-medical: No  Tobacco Use  . Smoking status: Current Every Day Smoker    Packs/day: 0.25    Years: 55.00    Pack years: 13.75    Types: Cigarettes  . Smokeless tobacco: Former Systems developer    Types: Snuff, Chew  Substance and Sexual Activity  . Alcohol use: Yes    Alcohol/week: 12.0 standard drinks    Types: 12 Cans of beer per week  . Drug use: No  . Sexual activity: Not on file  Lifestyle  . Physical activity:    Days per week: 0 days    Minutes per session: 0 min  . Stress: Not at all  Relationships  . Social connections:    Talks on phone: Patient refused    Gets together: Patient refused    Attends religious service: Patient refused    Active member of club or organization: Patient refused    Attends meetings of clubs or organizations:  Patient refused    Relationship status: Patient refused  Other Topics Concern  . Not on file  Social History Narrative  . Not on file    Outpatient Encounter Medications as of 12/26/2018  Medication Sig  . atorvastatin (LIPITOR) 40 MG tablet TAKE 1 TABLET(40 MG) BY MOUTH DAILY  . Cholecalciferol (VITAMIN D3) 1000 units CAPS Take by mouth daily.  Marland Kitchen NAPROXEN PO Take by mouth at bedtime. One oral  . traZODone (DESYREL) 50 MG tablet TAKE 1 TABLET BY MOUTH EVERY NIGHT AT BEDTIME  . triamterene-hydrochlorothiazide (DYAZIDE) 37.5-25 MG capsule TAKE ONE CAPSULE BY MOUTH EVERY DAY   No facility-administered encounter medications on file as of 12/26/2018.     Activities of Daily Living In your present state of health, do you have any difficulty performing the following activities: 12/26/2018  Hearing? N  Vision? Y  Comment Wears eye glasses daily.   Difficulty concentrating or making decisions? N  Walking or climbing stairs? N  Dressing or bathing? N  Doing errands, shopping? N  Preparing Food and eating ? N  Using the Toilet? N  In the past six months, have you accidently leaked urine? N  Do you have problems with loss of bowel control? N  Managing your Medications? N  Managing your Finances? N  Housekeeping or managing your Housekeeping? N  Some recent data might be hidden    Patient Care Team: [name], MD as PCP - General (Family Medicine)   Assessment:   This is a routine wellness examination for Brad Myers.  Exercise Activities and Dietary recommendations Current Exercise Habits: The patient does not participate in regular exercise at present, Exercise limited by: None identified  Goals    . DIET - INCREASE WATER INTAKE     Recommend increasing water intake to 2-4 glasses a day (currently drinking none).    . Exercise     Starting in Spring 2018, I will increase walking to at least 3 days a week for 30 minutes. I will also increase my exercise once joining the hiking  exercise for seniors at [facility].       Fall Risk: Fall Risk  12/26/2018 12/11/2017 11/29/2016 11/29/2016 06/26/2015  Falls in the past year? 0 No No No No    FALL RISK PREVENTION PERTAINING TO THE HOME:  Any stairs in or around the home? No  If so, are there any without handrails? N/A  Home free of loose throw rugs in walkways, pet beds, electrical cords, etc? Yes  Adequate lighting in your home  to reduce risk of falls? Yes   ASSISTIVE DEVICES UTILIZED TO PREVENT FALLS:  Life alert? No  Use of a cane, walker or w/c? No  Grab bars in the bathroom? No  Shower chair or bench in shower? No  Elevated toilet seat or a handicapped toilet? No   TIMED UP AND GO:  Was the test performed? No .    Depression Screen PHQ 2/9 Scores 12/26/2018 12/11/2017 11/29/2016 11/29/2016  PHQ - 2 Score 0 2 0 0  PHQ- 9 Score - 3 - -    Cognitive Function: Declined today.      6CIT Screen 11/29/2016  What Year? 0 points  What month? 0 points  What time? 0 points  Count back from 20 0 points  Months in reverse 0 points  Repeat phrase 0 points  Total Score 0    Immunization History  Administered Date(s) Administered  . Influenza, High Dose Seasonal PF 06/26/2015, 06/15/2018  . Influenza,inj,Quad PF,6+ Mos 07/24/2017  . Pneumococcal Conjugate-13 07/18/2014  . Pneumococcal Polysaccharide-23 01/23/2012  . Tdap 01/23/2012  . Zoster 05/18/2012    Qualifies for Shingles Vaccine? Yes  Zostavax completed 05/18/12. Due for Shingrix. Education has been provided regarding the importance of this vaccine. Pt has been advised to call insurance company to determine out of pocket expense. Advised may also receive vaccine at local pharmacy or Health Dept. Verbalized acceptance and understanding.  Tdap: Up to date  Flu Vaccine: Up to date  Pneumococcal Vaccine: Up to date  Screening Tests Health Maintenance  Topic Date Due  . TETANUS/TDAP  01/22/2022  . COLONOSCOPY  07/12/2023  . INFLUENZA  VACCINE  Completed  . Hepatitis C Screening  Completed  . PNA vac Low Risk Adult  Completed   Cancer Screenings:  Colorectal Screening: Completed 07/11/18. Repeat every 5 years.  Lung Cancer Screening: (Low Dose CT Chest recommended if Age 38-80 years, 30 pack-year currently smoking OR have quit w/in 15years.) does qualify, however is not due until 02/27/19.   Additional Screening:  Hepatitis C Screening: Up to date  Vision Screening: Recommended annual ophthalmology exams for early detection of glaucoma and other disorders of the eye.  Dental Screening: Recommended annual dental exams for proper oral hygiene  Community Resource Referral:  CRR required this visit?  No        Plan:  I have personally reviewed and addressed the Medicare Annual Wellness questionnaire and have noted the following in the patient's chart:  A. Medical and social history B. Use of alcohol, tobacco or illicit drugs  C. Current medications and supplements D. Functional ability and status E.  Nutritional status F.  Physical activity G. Advance directives H. List of other physicians I.  Hospitalizations, surgeries, and ER visits in previous 12 months J.  [place] such as hearing and vision if needed, cognitive and depression L. Referrals and appointments   In addition, I have reviewed and discussed with patient certain preventive protocols, quality metrics, and best practice recommendations. A written personalized care plan for preventive services as well as general preventive health recommendations were provided to patient.   [place] Score, [place]  0/01/5996 Nurse Health Advisor   Nurse Notes: None.

## 2018-12-29 ENCOUNTER — Encounter: Payer: Self-pay | Admitting: Family Medicine

## 2018-12-31 ENCOUNTER — Telehealth: Payer: Self-pay

## 2018-12-31 NOTE — Telephone Encounter (Signed)
-----   Message from [name], MD sent at 12/30/2018 11:51 AM EDT ----- Regarding: RE: Colonoscopy Surveillance Hi   since he has declined endoscopic attempt of removal with Dr Rush Landmark or surgery - there is nothing I can do to take the polyp out. I do not see why I would need to repeat the colonoscopy again since I cannot resect the polyp myself.   If you feel more comfortable that the patient discuss this with me - please have him return to my office after things with the COVID resolve in hopefully 2-3 months .   Please have your staff schedule an office visit with me ibn 8-12 weeks time. I will reiterate my thoughts to him. I have CC y staff Driscilla Grammes on this email   Thank you for reaching out and stay safe  Kiran   ----- Message ----- From: [name], MD Sent: 12/29/2018  10:06 AM EDT To: [name], MD Subject: Colonoscopy Surveillance                       Hi Dr. Vicente Males,  I saw Mr Barcellos this week for routine follow up. He elected not to have surgery for the cecal polyp. He didn't know when he should have a follow up colonoscopy and I didn't know either. I told him I would check with you and find out. I can get in touch with him and let him know, or I can just have him call your office to schedule a follow up. Let me know. Thanks!  Timmothy Sours

## 2018-12-31 NOTE — Telephone Encounter (Signed)
Unable to contact patient at this time.  [employer] message recording "caller is temporarily unavailable at this time".  Will try to contact again later.  Thanks  [employer]

## 2019-01-18 DIAGNOSIS — D485 Neoplasm of uncertain behavior of skin: Secondary | ICD-10-CM | POA: Diagnosis not present

## 2019-02-12 ENCOUNTER — Encounter: Payer: Self-pay | Admitting: *Deleted

## 2019-03-01 ENCOUNTER — Other Ambulatory Visit: Payer: Self-pay | Admitting: Family Medicine

## 2019-03-01 DIAGNOSIS — G4700 Insomnia, unspecified: Secondary | ICD-10-CM

## 2019-03-28 ENCOUNTER — Ambulatory Visit: Payer: Medicare Other | Admitting: Gastroenterology

## 2019-03-28 ENCOUNTER — Ambulatory Visit (INDEPENDENT_AMBULATORY_CARE_PROVIDER_SITE_OTHER): Payer: Medicare Other | Admitting: Gastroenterology

## 2019-03-28 DIAGNOSIS — K635 Polyp of colon: Secondary | ICD-10-CM

## 2019-03-28 DIAGNOSIS — D120 Benign neoplasm of cecum: Secondary | ICD-10-CM | POA: Diagnosis not present

## 2019-03-28 NOTE — Progress Notes (Signed)
Brad Myers  [address]  [place]  [place], [place] [address]  Main: [phone]  Fax: [phone]   Gastroenterology Consultation  Referring Provider:     [name], MD Primary Care Physician:  Brad Sons, MD Reason for Consultation:     Colon polyp        HPI:   Virtual Visit via Telephone Note  I connected with patient on 03/28/19 at  8:30 AM EDT by telephone and verified that I am speaking with the correct person using two identifiers.   I discussed the limitations, risks, security and privacy concerns of performing an evaluation and management service by telephone and the availability of in person appointments. I also discussed with the patient that there may be a patient responsible charge related to this service. The patient expressed understanding and agreed to proceed.  Location of the patient: Home Location of provider: Home Participating persons: Patient and provider only   History of Present Illness: Chief Complaint  Patient presents with  . Follow-up    Colon polyp     Brad Myers is a 73 y.o. y/o male referred for consultation & management  by Brad Myers, Brad Peri, MD.   I performed a screening colonoscopy in 07/2018 on him  , multiple polyps some complex were resected, there was one at the appendicular orifice which I didn't attempt to resect and referred to Brad Myers. He chose a surgical option. Sibsequently he declined it. I spoke to him today and explained that it was very important to have the polyp at the end of the colon resected as it may be pre cancerous. He is aware of the risks and at present is not interested. Is aware risk of transformation into cancer. He says he will discuss with Brad Myers later in the year and if he decides to go ahead will call Brad Myers office. He also declined any genetic testing , he has informed his daughter to get a colonoscopy .   Past Medical History:  Diagnosis Date  . History of  chicken pox 06/24/2015   DID have Chicken Pox. DID have Measles. DID have Mumps.      Past Surgical History:  Procedure Laterality Date  . COLONOSCOPY WITH PROPOFOL N/A 07/11/2018   Procedure: COLONOSCOPY WITH PROPOFOL;  Surgeon: Brad Bellows, MD;  Location: [facility];  Service: Gastroenterology;  Laterality: N/A;  . EYE SURGERY Right 2003  . HERNIA REPAIR Right 2005    Prior to Admission medications   Medication Sig Start Date End Date Taking? Authorizing Provider  atorvastatin (LIPITOR) 40 MG tablet TAKE 1 TABLET(40 MG) BY MOUTH DAILY 02/28/18  Yes Brad Sons, MD  Cholecalciferol (VITAMIN D3) 1000 units CAPS Take by mouth daily.   Yes [provider]  NAPROXEN PO Take by mouth at bedtime. One oral   Yes [provider]  traZODone (DESYREL) 50 MG tablet TAKE 1 TABLET BY MOUTH EVERY NIGHT AT BEDTIME 03/01/19  Yes Brad Sons, MD  triamterene-hydrochlorothiazide (DYAZIDE) 37.5-25 MG capsule TAKE ONE CAPSULE BY MOUTH EVERY DAY 08/13/18  Yes Brad Sons, MD    Family History  Problem Relation Age of Onset  . Lung cancer Mother        dx 47's, died 65. unk if 2 primary or met  . Brain cancer Mother        dx 13's  . Diabetes Father   . Coronary artery disease Father   .  Colon cancer Neg Hx   . Esophageal cancer Neg Hx   . Inflammatory bowel disease Neg Hx   . Liver disease Neg Hx   . Pancreatic cancer Neg Hx   . Rectal cancer Neg Hx   . Stomach cancer Neg Hx      Social History   Tobacco Use  . Smoking status: Current Every Day Smoker    Packs/day: 0.25    Years: 55.00    Pack years: 13.75    Types: Cigarettes  . Smokeless tobacco: Former Systems developer    Types: Snuff, Chew  . Tobacco comment: smoked up to 1/2 ppd since 73yo  Substance Use Topics  . Alcohol use: Yes    Alcohol/week: 12.0 standard drinks    Types: 12 Cans of beer per week  . Drug use: No    Allergies as of 03/28/2019  . (No Known Allergies)    Review of Systems:    All  systems reviewed and negative except where noted in HPI.   Observations/Objective:  Labs: CBC    Component Value Date/Time   WBC 8.4 02/12/2012 0508   RBC 4.32 (L) 02/12/2012 0508   HGB 14.3 02/12/2012 0508   HCT 41.3 02/12/2012 0508   PLT 172 02/12/2012 0508   MCV 96 02/12/2012 0508   MCH 33.2 02/12/2012 0508   MCHC 34.7 02/12/2012 0508   RDW 13.2 02/12/2012 0508   LYMPHSABS 1.6 02/12/2012 0508   MONOABS 0.7 02/12/2012 0508   EOSABS 0.2 02/12/2012 0508   BASOSABS 0.0 02/12/2012 0508   CMP     Component Value Date/Time   NA 138 06/15/2018 1021   NA 134 (L) 02/12/2012 0508   K 3.9 06/15/2018 1021   K 3.6 02/12/2012 0508   CL 98 06/15/2018 1021   CL 103 02/12/2012 0508   CO2 24 06/15/2018 1021   CO2 27 02/12/2012 0508   GLUCOSE 91 06/15/2018 1021   GLUCOSE 103 (H) 02/12/2012 0508   BUN 14 06/15/2018 1021   BUN 15 02/12/2012 0508   CREATININE 1.03 06/15/2018 1021   CREATININE 1.08 02/12/2012 0508   CALCIUM 9.6 06/15/2018 1021   CALCIUM 8.8 02/12/2012 0508   PROT 6.9 06/15/2018 1021   PROT 7.3 02/12/2012 0508   ALBUMIN 4.4 06/15/2018 1021   ALBUMIN 3.8 02/12/2012 0508   AST 11 06/15/2018 1021   AST 19 02/12/2012 0508   ALT 15 06/15/2018 1021   ALT 23 02/12/2012 0508   ALKPHOS 60 06/15/2018 1021   ALKPHOS 58 02/12/2012 0508   BILITOT 0.4 06/15/2018 1021   BILITOT 0.3 02/12/2012 0508   GFRNONAA 73 06/15/2018 1021   GFRNONAA >60 02/12/2012 0508   GFRAA 84 06/15/2018 1021   GFRAA >60 02/12/2012 0508    Imaging Studies: No results found.  Assessment and Plan:   Brad Myers is a 73 y.o. y/o male had a complex polyp at the appendicular orifice on his screening colonoscopy in 07/2018- in addition he had numerous other polyps. He declined advanced polypectomy which I recommended and referred him to Brad Brad Myers at [place], he initially preferred surgery but declined that too later. At present he is aware of the risks of not getting the polyp out and says he  will discuss with Brad Myers later in the year and call Brad Brad Myers office if he decides to go for resection.    I discussed the assessment and treatment plan with the patient. The patient was provided an opportunity to ask questions and all were  answered. The patient agreed with the plan and demonstrated an understanding of the instructions.   The patient was advised to call back or seek an in-person evaluation if the symptoms worsen or if the condition fails to improve as anticipated.  I provided 12 minutes of non-face-to-face time during this encounter.   Brad Brad Bellows MD,MRCP [facility]) Gastroenterology/Hepatology Pager: [phone]   Speech recognition software was used to dictate the above note.

## 2019-03-29 ENCOUNTER — Telehealth: Payer: Self-pay | Admitting: *Deleted

## 2019-03-29 NOTE — Telephone Encounter (Signed)
Patient has been notified that lung cancer screening CT scan is due currently or will be in near future. Confirmed that patient is within the appropriate age range, and asymptomatic, (no signs or symptoms of lung cancer). Patient denies illness that would prevent curative treatment for lung cancer if found. Verified smoking history Currently Smoking. Patient is agreeable for CT scan being scheduled."

## 2019-04-03 ENCOUNTER — Other Ambulatory Visit: Payer: Self-pay | Admitting: *Deleted

## 2019-04-03 DIAGNOSIS — Z87891 Personal history of nicotine dependence: Secondary | ICD-10-CM

## 2019-04-03 DIAGNOSIS — Z122 Encounter for screening for malignant neoplasm of respiratory organs: Secondary | ICD-10-CM

## 2019-04-11 ENCOUNTER — Other Ambulatory Visit: Payer: Self-pay

## 2019-04-11 ENCOUNTER — Ambulatory Visit
Admission: RE | Admit: 2019-04-11 | Discharge: 2019-04-11 | Disposition: A | Discharge status: Home / Self Care | Payer: Medicare Other | Source: Ambulatory Visit | Attending: Oncology | Admitting: Oncology

## 2019-04-11 DIAGNOSIS — Z87891 Personal history of nicotine dependence: Secondary | ICD-10-CM | POA: Diagnosis not present

## 2019-04-11 DIAGNOSIS — Z122 Encounter for screening for malignant neoplasm of respiratory organs: Secondary | ICD-10-CM | POA: Insufficient documentation

## 2019-04-15 ENCOUNTER — Encounter: Payer: Self-pay | Admitting: Family Medicine

## 2019-04-15 ENCOUNTER — Encounter: Payer: Self-pay | Admitting: *Deleted

## 2019-04-15 DIAGNOSIS — I700 Atherosclerosis of aorta: Secondary | ICD-10-CM | POA: Insufficient documentation

## 2019-04-15 DIAGNOSIS — J439 Emphysema, unspecified: Secondary | ICD-10-CM | POA: Insufficient documentation

## 2019-05-31 ENCOUNTER — Other Ambulatory Visit: Payer: Self-pay | Admitting: Family Medicine

## 2019-07-02 ENCOUNTER — Ambulatory Visit (INDEPENDENT_AMBULATORY_CARE_PROVIDER_SITE_OTHER): Payer: Medicare Other | Admitting: Family Medicine

## 2019-07-02 ENCOUNTER — Encounter: Payer: Self-pay | Admitting: Family Medicine

## 2019-07-02 ENCOUNTER — Other Ambulatory Visit: Payer: Self-pay

## 2019-07-02 VITALS — BP 116/64 | HR 66 | Temp 96.8°F | Resp 15 | Wt 127.4 lb

## 2019-07-02 DIAGNOSIS — Z86010 Personal history of colonic polyps: Secondary | ICD-10-CM

## 2019-07-02 DIAGNOSIS — I10 Essential (primary) hypertension: Secondary | ICD-10-CM

## 2019-07-02 DIAGNOSIS — J439 Emphysema, unspecified: Secondary | ICD-10-CM

## 2019-07-02 DIAGNOSIS — Z125 Encounter for screening for malignant neoplasm of prostate: Secondary | ICD-10-CM

## 2019-07-02 DIAGNOSIS — I700 Atherosclerosis of aorta: Secondary | ICD-10-CM | POA: Diagnosis not present

## 2019-07-02 DIAGNOSIS — F17210 Nicotine dependence, cigarettes, uncomplicated: Secondary | ICD-10-CM

## 2019-07-02 DIAGNOSIS — Z23 Encounter for immunization: Secondary | ICD-10-CM | POA: Diagnosis not present

## 2019-07-02 DIAGNOSIS — E785 Hyperlipidemia, unspecified: Secondary | ICD-10-CM | POA: Diagnosis not present

## 2019-07-02 NOTE — Progress Notes (Signed)
Patient: Brad Myers Male    DOB: 1945-12-11   73 y.o.   MRN: [id number] Visit Date: 07/02/2019  Today's Provider: Lelon Huh, MD   Chief Complaint  Patient presents with  . Hypertension  . Hyperlipidemia   Subjective:     HPI  Hypertension, follow-up:  BP Readings from Last 3 Encounters:  07/02/19 116/64  12/26/18 122/72  12/26/18 122/72    He was last seen for hypertension 6 months ago.  BP at that visit was 122/72. Management changes since that visit include none. He reports excellent compliance with treatment. He is not having side effects.  He is exercising. He is adherent to low salt diet.   Outside blood pressures are not being checked regulary. He is experiencing none.  Patient denies chest pain, chest pressure/discomfort, claudication, dyspnea, exertional chest pressure/discomfort, fatigue, irregular heart beat, lower extremity edema, near-syncope, orthopnea, palpitations, paroxysmal nocturnal dyspnea, syncope and tachypnea.   Cardiovascular risk factors include advanced age (older than 92 for men, 22 for women), hypertension, male gender and smoking/ tobacco exposure.  Use of agents associated with hypertension: none.     Weight trend: stable Wt Readings from Last 3 Encounters:  07/02/19 127 lb 6.4 oz (57.8 kg)  04/11/19 125 lb (56.7 kg)  12/26/18 131 lb (59.4 kg)    Current diet: well balanced  ------------------------------------------------------------------------  Lipid/Cholesterol, Follow-up:   Last seen for this6 months ago.  Management changes since that visit include none . Marland Kitchen Last Lipid Panel:    Component Value Date/Time   CHOL 144 06/15/2018 1021   TRIG 161 (H) 06/15/2018 1021   HDL 44 06/15/2018 1021   CHOLHDL 3.3 06/15/2018 1021   LDLCALC 68 06/15/2018 1021    Risk factors for vascular disease include hypertension  He reports excellent compliance with treatment. He is not having side effects.  Current symptoms  include none and have been stable. Weight trend: stable Prior visit with dietician: no Current diet: well balanced Current exercise: walking  Wt Readings from Last 3 Encounters:  07/02/19 127 lb 6.4 oz (57.8 kg)  04/11/19 125 lb (56.7 kg)  12/26/18 131 lb (59.4 kg)    -------------------------------------------------------------------  No Known Allergies   Current Outpatient Medications:  .  atorvastatin (LIPITOR) 40 MG tablet, TAKE 1 TABLET(40 MG) BY MOUTH DAILY, Disp: 30 tablet, Rfl: 12 .  Cholecalciferol (VITAMIN D3) 1000 units CAPS, Take by mouth daily., Disp: , Rfl:  .  NAPROXEN PO, Take by mouth at bedtime. One oral, Disp: , Rfl:  .  traZODone (DESYREL) 50 MG tablet, TAKE 1 TABLET BY MOUTH EVERY NIGHT AT BEDTIME, Disp: 30 tablet, Rfl: 5 .  triamterene-hydrochlorothiazide (DYAZIDE) 37.5-25 MG capsule, TAKE ONE CAPSULE BY MOUTH EVERY DAY, Disp: 90 capsule, Rfl: 3  Review of Systems  Constitutional: Negative for appetite change, chills and fever.  Respiratory: Negative for chest tightness, shortness of breath and wheezing.   Cardiovascular: Negative for chest pain and palpitations.  Gastrointestinal: Negative for abdominal pain, nausea and vomiting.    Social History   Tobacco Use  . Smoking status: Current Every Day Smoker    Packs/day: 0.25    Years: 55.00    Pack years: 13.75    Types: Cigarettes  . Smokeless tobacco: Former Systems developer    Types: Snuff, Chew  . Tobacco comment: smoked up to 1/2 ppd since 73yo  Substance Use Topics  . Alcohol use: Yes    Alcohol/week: 12.0 standard drinks    Types:  12 Cans of beer per week      Objective:   BP 116/64   Pulse 66   Temp (!) 96.8 F (36 C) (Oral)   Resp 15   Wt 127 lb 6.4 oz (57.8 kg)   SpO2 99%   BMI 21.20 kg/m  Vitals:   07/02/19 0955  BP: 116/64  Pulse: 66  Resp: 15  Temp: (!) 96.8 F (36 C)  TempSrc: Oral  SpO2: 99%  Weight: 127 lb 6.4 oz (57.8 kg)  Body mass index is 21.2 kg/m.   Physical Exam    General Appearance:    Alert, cooperative, no distress  Eyes:    PERRL, conjunctiva/corneas clear, EOM's intact       Lungs:     Clear to auscultation bilaterally, respirations unlabored  Heart:    Normal heart rate. Normal rhythm. No murmurs, rubs, or gallops.   MS:   All extremities are intact.   Neurologic:   Awake, alert, oriented x 3. No apparent focal neurological           defect.          Assessment & Plan    1. Essential (primary) hypertension Well controlled.  Continue current medications.   - Comprehensive metabolic panel  2. Pulmonary emphysema, unspecified emphysema type ([place]) No significant symptoms. encouraged complete smoking cessation  3. Aortic atherosclerosis (HCC) Asymptomatic. Compliant with medication.  Continue aggressive risk factor modification.    4. Smoking greater than 30 pack years Stop smoking. Continue annual LDCT  5. Prostate cancer screening  - PSA  6. Hyperlipidemia, unspecified hyperlipidemia type He is tolerating atorvastatin well with no adverse effects.   - Comprehensive metabolic panel - Lipid panel  7. History of adenomatous polyp of colon Discussed surgical excision of complex polyp on last colonoscopy. He is still not interested in following up with Dr. Kirstie Mirza.   8. Need for influenza vaccination  - Flu Vaccine QUAD High Dose(Fluad)     Lelon Huh, MD  [facility]

## 2019-07-02 NOTE — Patient Instructions (Addendum)
.   Please review the attached list of medications and notify my office if there are any errors.   . Please bring all of your medications to every appointment so we can make sure that our medication list is the same as yours.    It is especially important to get the annual flu vaccine this year. If you haven't had it already, please go to your pharmacy or call the office as soon as possible to schedule you flu shot.   Please stop smoking

## 2019-07-03 LAB — LIPID PANEL
Chol/HDL Ratio: 2.8 ratio (ref 0.0–5.0)
Cholesterol, Total: 144 mg/dL (ref 100–199)
HDL: 51 mg/dL (ref >39)
LDL Chol Calc (NIH): 67 mg/dL (ref 0–99)
Triglycerides: 149 mg/dL (ref 0–149)
VLDL Cholesterol Cal: 26 mg/dL (ref 5–40)

## 2019-07-03 LAB — COMPREHENSIVE METABOLIC PANEL
ALT: 14 IU/L (ref 0–44)
AST: 15 IU/L (ref 0–40)
Albumin/Globulin Ratio: 1.7 (ref 1.2–2.2)
Albumin: 4.4 g/dL (ref 3.7–4.7)
Alkaline Phosphatase: 69 IU/L (ref 39–117)
BUN/Creatinine Ratio: 15 (ref 10–24)
BUN: 15 mg/dL (ref 8–27)
Bilirubin Total: 0.5 mg/dL (ref 0.0–1.2)
CO2: 25 mmol/L (ref 20–29)
Calcium: 9.3 mg/dL (ref 8.6–10.2)
Chloride: 98 mmol/L (ref 96–106)
Creatinine, Ser: 1.03 mg/dL (ref 0.76–1.27)
GFR calc Af Amer: 84 mL/min/{1.73_m2} (ref >59)
GFR calc non Af Amer: 72 mL/min/{1.73_m2} (ref >59)
Globulin, Total: 2.6 g/dL (ref 1.5–4.5)
Glucose: 87 mg/dL (ref 65–99)
Potassium: 3.9 mmol/L (ref 3.5–5.2)
Sodium: 138 mmol/L (ref 134–144)
Total Protein: 7 g/dL (ref 6.0–8.5)

## 2019-07-03 LAB — PSA: Prostate Specific Ag, Serum: 2.8 ng/mL (ref 0.0–4.0)

## 2019-07-04 ENCOUNTER — Telehealth: Payer: Self-pay

## 2019-07-04 NOTE — Telephone Encounter (Signed)
advised

## 2019-07-04 NOTE — Telephone Encounter (Signed)
LMTCB

## 2019-07-04 NOTE — Telephone Encounter (Signed)
-----   Message from [name], MD sent at 07/03/2019  9:18 PM EDT ----- Blood sugar, kidney functions, PSA electrolytes and cholesterol are all normal. Continue current medications.  Check labs yearly.

## 2019-09-07 ENCOUNTER — Other Ambulatory Visit: Payer: Self-pay | Admitting: Family Medicine

## 2019-09-07 DIAGNOSIS — G4700 Insomnia, unspecified: Secondary | ICD-10-CM

## 2019-09-16 DIAGNOSIS — L538 Other specified erythematous conditions: Secondary | ICD-10-CM | POA: Diagnosis not present

## 2019-09-16 DIAGNOSIS — Z85828 Personal history of other malignant neoplasm of skin: Secondary | ICD-10-CM | POA: Diagnosis not present

## 2019-09-16 DIAGNOSIS — D2272 Melanocytic nevi of left lower limb, including hip: Secondary | ICD-10-CM | POA: Diagnosis not present

## 2019-09-16 DIAGNOSIS — L820 Inflamed seborrheic keratosis: Secondary | ICD-10-CM | POA: Diagnosis not present

## 2019-09-16 DIAGNOSIS — D2262 Melanocytic nevi of left upper limb, including shoulder: Secondary | ICD-10-CM | POA: Diagnosis not present

## 2019-09-16 DIAGNOSIS — D225 Melanocytic nevi of trunk: Secondary | ICD-10-CM | POA: Diagnosis not present

## 2019-09-16 DIAGNOSIS — D2261 Melanocytic nevi of right upper limb, including shoulder: Secondary | ICD-10-CM | POA: Diagnosis not present

## 2019-09-16 DIAGNOSIS — D485 Neoplasm of uncertain behavior of skin: Secondary | ICD-10-CM | POA: Diagnosis not present

## 2019-09-17 DIAGNOSIS — M1812 Unilateral primary osteoarthritis of first carpometacarpal joint, left hand: Secondary | ICD-10-CM | POA: Diagnosis not present

## 2019-09-17 DIAGNOSIS — M189 Osteoarthritis of first carpometacarpal joint, unspecified: Secondary | ICD-10-CM | POA: Diagnosis not present

## 2019-10-06 ENCOUNTER — Other Ambulatory Visit: Payer: Self-pay | Admitting: Family Medicine

## 2019-10-06 DIAGNOSIS — I10 Essential (primary) hypertension: Secondary | ICD-10-CM

## 2019-11-29 ENCOUNTER — Ambulatory Visit: Payer: Medicare Other | Admitting: Family Medicine

## 2019-11-30 ENCOUNTER — Other Ambulatory Visit: Payer: Self-pay | Admitting: Family Medicine

## 2019-11-30 DIAGNOSIS — G4700 Insomnia, unspecified: Secondary | ICD-10-CM

## 2019-12-26 DIAGNOSIS — M189 Osteoarthritis of first carpometacarpal joint, unspecified: Secondary | ICD-10-CM | POA: Insufficient documentation

## 2019-12-26 NOTE — Progress Notes (Signed)
        Patient: Brad Myers   DOB: 1946-07-16   74 y.o. Male  MRN: [id number] Visit Date: 12/27/2019  Today's Provider: Lelon Huh, MD  Subjective:    Chief Complaint  Patient presents with  . Hernia   HPI  Follow up for Hernia:    This is patient's first time being seen for possible hernia. Noticed swelling that comes and goes for the last month Changes made at last visit include none.  He reports good compliance with treatment. He feels that condition is stable. He is not having side effects.   ------------------------------------------------------------------------------------   Past Surgical History:  Procedure Laterality Date  . COLONOSCOPY WITH PROPOFOL N/A 07/11/2018   Procedure: COLONOSCOPY WITH PROPOFOL;  Surgeon: [name], MD;  Location: [facility] ENDOSCOPY;  Service: Gastroenterology;  Laterality: N/A;  . EYE SURGERY Right 2003  . HERNIA REPAIR Right 2005     Medications: Outpatient Medications Prior to Visit  Medication Sig Dispense Refill  . atorvastatin (LIPITOR) 40 MG tablet TAKE 1 TABLET(40 MG) BY MOUTH DAILY 30 tablet 12  . Cholecalciferol (VITAMIN D3) 1000 units CAPS Take by mouth daily.    Marland Kitchen NAPROXEN PO Take by mouth at bedtime. One oral    . traZODone (DESYREL) 50 MG tablet TAKE 1 TABLET BY MOUTH EVERY NIGHT AT BEDTIME 30 tablet 0  . triamterene-hydrochlorothiazide (DYAZIDE) 37.5-25 MG capsule TAKE 1 CAPSULE BY MOUTH EVERY DAY 90 capsule 3   No facility-administered medications prior to visit.      Review of Systems  Constitutional: Negative.   Respiratory: Negative.   Cardiovascular: Negative.   Musculoskeletal: Negative.       Objective:   There were no vitals taken for this visit. Vitals:   12/27/19 0848  BP: (!) 149/79  Pulse: 76  Temp: (!) 96.9 F (36.1 C)  TempSrc: Temporal  Weight: 127 lb 12.8 oz (58 kg)  Height: 5\' 5"  (1.651 m)     Physical Exam   Small tender bulging left inguinal area with palpation. No scrotal  masses.     Assessment & Plan:    1. Right inguinal hernia Small and only bothers him intermittently.     Lelon Huh, MD  [facility] [phone] (phone) [phone] (fax)  [place]

## 2019-12-26 NOTE — Progress Notes (Signed)
Subjective:   Brad Myers is a 74 y.o. male who presents for Medicare Annual/Subsequent preventive examination.  Review of Systems:  N/A  Cardiac Risk Factors include: advanced age (>54mn, >>8women);male gender;hypertension;sedentary lifestyle     Objective:    Vitals: There were no vitals taken for this visit.  There is no height or weight on file to calculate BMI. Unable to obtain vitals due to visit being conducted via telephonically.   Advanced Directives 12/30/2019 12/26/2018 07/11/2018 12/11/2017 11/29/2016  Does Patient Have a Medical Advance Directive? Yes Yes No Yes Yes  Type of AParamedicof ADeer CreekLiving will HNesika BeachLiving will - [place]orgetownLiving will H[place] GroveLiving will  Copy of [place] Chart? Yes - validated most recent copy scanned in chart (See row information) Yes - validated most recent copy scanned in chart (See row information) - Yes No - copy requested    Tobacco Social History   Tobacco Use  Smoking Status Current Every Day Smoker  . Packs/day: 0.50  . Years: 55.00  . Pack years: 27.50  . Types: Cigarettes  Smokeless Tobacco Former USystems developer . Types: Snuff, Chew  Tobacco Comment   smoked up to 1/2 ppd since 74yo     Ready to quit: No Counseling given: No Comment: smoked up to 1/2 ppd since 74yo   Clinical Intake:  Pre-visit preparation completed: Yes  Pain : No/denies pain Pain Score: 0-No pain     Nutritional Risks: None Diabetes: No  How often do you need to have someone help you when you read instructions, pamphlets, or other written materials from your doctor or pharmacy?: 1 - Never  Interpreter Needed?: No  [facility]  Past Medical History:  Diagnosis Date  . Headache, migraine 06/24/2015  . History of chicken pox 06/24/2015   DID have Chicken Pox. DID have Measles. DID have Mumps.    .  Hyperlipidemia   . Hypertension   . Stomach ulcer 06/24/2015   Past Surgical History:  Procedure Laterality Date  . COLONOSCOPY WITH PROPOFOL N/A 07/11/2018   Procedure: COLONOSCOPY WITH PROPOFOL;  Surgeon: AJonathon Bellows MD;  [facility];  Service: Gastroenterology;  Laterality: N/A;  . EYE SURGERY Right 2003  . HERNIA REPAIR Right 2005   Family History  Problem Relation Age of Onset  . Lung cancer Mother        dx 661's died 650 unk if 2 primary or met  . Brain cancer Mother        dx 617's . Diabetes Father   . Coronary artery disease Father   . Colon cancer Neg Hx   . Esophageal cancer Neg Hx   . Inflammatory bowel disease Neg Hx   . Liver disease Neg Hx   . Pancreatic cancer Neg Hx   . Rectal cancer Neg Hx   . Stomach cancer Neg Hx    Social History   Socioeconomic History  . Marital status: Divorced    Spouse name: Not on file  . Number of children: 1  . Years of education: Not on file  . Highest education level: Some college, no degree  Occupational History  . Occupation: retired  Tobacco Use  . Smoking status: Current Every Day Smoker    Packs/day: 0.50    Years: 55.00    Pack years: 27.50    Types: Cigarettes  . Smokeless tobacco: Former USystems developer  Types: Snuff, Chew  . Tobacco comment: smoked up to 1/2 ppd since 74yo  Substance and Sexual Activity  . Alcohol use: Yes    Alcohol/week: 24.0 standard drinks    Types: 24 Cans of beer per week    Comment: drinks 3-4 days a week  . Drug use: No  . Sexual activity: Not on file  Other Topics Concern  . Not on file  Social History Narrative  . Not on file   Social Determinants of Health   Financial Resource Strain: Low Risk   . Difficulty of Paying Living Expenses: Not hard at all  Food Insecurity: No Food Insecurity  . Worried About Charity fundraiser in the Last Year: Never true  . Ran Out of Food in the Last Year: Never true  Transportation Needs: No Transportation Needs  . Lack of  Transportation (Medical): No  . Lack of Transportation (Non-Medical): No  Physical Activity: Insufficiently Active  . Days of Exercise per Week: 3 days  . Minutes of Exercise per Session: 30 min  Stress: No Stress Concern Present  . Feeling of Stress : Not at all  Social Connections: Moderately Isolated  . Frequency of Communication with Friends and Family: More than three times a week  . Frequency of Social Gatherings with Friends and Family: Once a week  . Attends Religious Services: Never  . Active Member of Clubs or Organizations: Not on file  . Attends Archivist Meetings: Never  . Marital Status: Divorced    Outpatient Encounter Medications as of 12/30/2019  Medication Sig  . atorvastatin (LIPITOR) 40 MG tablet TAKE 1 TABLET(40 MG) BY MOUTH DAILY  . Cholecalciferol (VITAMIN D3) 1000 units CAPS Take by mouth daily.  Marland Kitchen NAPROXEN PO Take by mouth at bedtime. One oral  . traZODone (DESYREL) 50 MG tablet TAKE 1 TABLET BY MOUTH EVERY NIGHT AT BEDTIME  . triamterene-hydrochlorothiazide (DYAZIDE) 37.5-25 MG capsule TAKE 1 CAPSULE BY MOUTH EVERY DAY   No facility-administered encounter medications on file as of 12/30/2019.    Activities of Daily Living In your present state of health, do you have any difficulty performing the following activities: 12/30/2019  Hearing? N  Vision? N  Difficulty concentrating or making decisions? N  Walking or climbing stairs? N  Dressing or bathing? N  Doing errands, shopping? N  Preparing Food and eating ? N  Using the Toilet? N  In the past six months, have you accidently leaked urine? N  Do you have problems with loss of bowel control? N  Managing your Medications? N  Managing your Finances? N  Housekeeping or managing your Housekeeping? N  Some recent data might be hidden    Patient Care Team: [name], MD as PCP - General (Family Medicine)   Assessment:   This is a routine wellness examination for Brad Myers.  Exercise  Activities and Dietary recommendations Current Exercise Habits: Home exercise routine, Type of exercise: walking, Time (Minutes): 30, Frequency (Times/Week): 3, Weekly Exercise (Minutes/Week): 90, Intensity: Mild, Exercise limited by: None identified  Goals    . DIET - INCREASE WATER INTAKE     Recommend increasing water intake to 2-4 glasses a day (currently drinking none).    . Quit Smoking     Recommend to continue efforts to reduce smoking habits until no longer smoking.        Fall Risk: Fall Risk  12/30/2019 12/26/2018 12/11/2017 11/29/2016 11/29/2016  Falls in the past year? 0 0 No No No  Number falls in past yr: 0 - - - -  Injury with Fall? 0 - - - -    FALL RISK PREVENTION PERTAINING TO THE HOME:  Any stairs in or around the home? No  If so, are there any without handrails? N/A  Home free of loose throw rugs in walkways, pet beds, electrical cords, etc? Yes  Adequate lighting in your home to reduce risk of falls? Yes   ASSISTIVE DEVICES UTILIZED TO PREVENT FALLS:  Life alert? No  Use of a cane, walker or w/c? No  Grab bars in the bathroom? No  Shower chair or bench in shower? No  Elevated toilet seat or a handicapped toilet? No   TIMED UP AND GO:  Was the test performed? No .    Depression Screen PHQ 2/9 Scores 12/30/2019 12/26/2018 12/11/2017 11/29/2016  PHQ - 2 Score 0 0 2 0  PHQ- 9 Score - - 3 -    Cognitive Function: Declined today.     6CIT Screen 11/29/2016  What Year? 0 points  What month? 0 points  What time? 0 points  Count back from 20 0 points  Months in reverse 0 points  Repeat phrase 0 points  Total Score 0    Immunization History  Administered Date(s) Administered  . Fluad Quad(high Dose 65+) 07/02/2019  . Influenza, High Dose Seasonal PF 06/26/2015, 06/15/2018  . Influenza,inj,Quad PF,6+ Mos 07/24/2017  . Pneumococcal Conjugate-13 07/18/2014  . Pneumococcal Polysaccharide-23 01/23/2012  . Tdap 01/23/2012  . Zoster 05/18/2012     Qualifies for Shingles Vaccine? Yes  Zostavax completed 05/18/12. Due for Shingrix. Pt has been advised to call insurance company to determine out of pocket expense. Advised may also receive vaccine at local pharmacy or Health Dept. Verbalized acceptance and understanding.  Tdap: Up to date  Flu Vaccine: Up to date  Pneumococcal Vaccine: Completed series  Screening Tests Health Maintenance  Topic Date Due  . TETANUS/TDAP  01/22/2022  . COLONOSCOPY  07/12/2023  . INFLUENZA VACCINE  Completed  . Hepatitis C Screening  Completed  . PNA vac Low Risk Adult  Completed   Cancer Screenings:  Colorectal Screening: Completed 07/11/18. Repeat every 5 years.   Lung Cancer Screening: (Low Dose CT Chest recommended if Age 30-80 years, 30 pack-year currently smoking OR have quit w/in 15years.) does qualify however had this completed 04/11/19. Repeat yearly.  Additional Screening:  Hepatitis C Screening: Up to date  Vision Screening: Recommended annual ophthalmology exams for early detection of glaucoma and other disorders of the eye.  Dental Screening: Recommended annual dental exams for proper oral hygiene  Community Resource Referral:  CRR required this visit?  No        Plan:  I have personally reviewed and addressed the Medicare Annual Wellness questionnaire and have noted the following in the patient's chart:  A. Medical and social history B. Use of alcohol, tobacco or illicit drugs  C. Current medications and supplements D. Functional ability and status E.  Nutritional status F.  Physical activity G. Advance directives H. List of other physicians I.  Hospitalizations, surgeries, and ER visits in previous 12 months J.  [place] such as hearing and vision if needed, cognitive and depression L. Referrals and appointments   In addition, I have reviewed and discussed with patient certain preventive protocols, quality metrics, and best practice recommendations. A  written personalized care plan for preventive services as well as general preventive health recommendations were provided to patient.  Glendora Score, [place]  01/16/8285 Nurse Health Advisor   Nurse Notes: None.

## 2019-12-27 ENCOUNTER — Encounter: Payer: Self-pay | Admitting: Family Medicine

## 2019-12-27 ENCOUNTER — Ambulatory Visit (INDEPENDENT_AMBULATORY_CARE_PROVIDER_SITE_OTHER): Payer: Medicare Other | Admitting: Family Medicine

## 2019-12-27 ENCOUNTER — Other Ambulatory Visit: Payer: Self-pay

## 2019-12-27 VITALS — BP 149/79 | HR 76 | Temp 96.9°F | Ht 65.0 in | Wt 127.8 lb

## 2019-12-27 DIAGNOSIS — K4090 Unilateral inguinal hernia, without obstruction or gangrene, not specified as recurrent: Secondary | ICD-10-CM

## 2019-12-30 ENCOUNTER — Ambulatory Visit (INDEPENDENT_AMBULATORY_CARE_PROVIDER_SITE_OTHER): Payer: Medicare Other

## 2019-12-30 ENCOUNTER — Other Ambulatory Visit: Payer: Self-pay

## 2019-12-30 DIAGNOSIS — Z0000 Encounter for general adult medical examination without abnormal findings: Secondary | ICD-10-CM

## 2019-12-30 NOTE — Patient Instructions (Signed)
[name] , Thank you for taking time to come for your Medicare Wellness Visit. I appreciate your ongoing commitment to your health goals. Please review the following plan we discussed and let me know if I can assist you in the future.   Screening recommendations/referrals: Colonoscopy: Up to date, due 07/2023 Recommended yearly ophthalmology/optometry visit for glaucoma screening and checkup Recommended yearly dental visit for hygiene and checkup  Vaccinations: Influenza vaccine: Up to date Pneumococcal vaccine: Completed series Tdap vaccine: Up to date, due 01/2022 Shingles vaccine: Pt declines today.     Advanced directives: Currently on file  Conditions/risks identified: Smoking cessation discussed today. Recommend increasing water intake to 6-8 8 oz glasses or as tolerated.   Next appointment: 01/03/20 @ 10:00 AM with Dr Caryn Section.   Preventive Care 65 Years and Older, Male Preventive care refers to lifestyle choices and visits with your health care provider that can promote health and wellness. What does preventive care include?  A yearly physical exam. This is also called an annual well check.  Dental exams once or twice a year.  Routine eye exams. Ask your health care provider how often you should have your eyes checked.  Personal lifestyle choices, including:  Daily care of your teeth and gums.  Regular physical activity.  Eating a healthy diet.  Avoiding tobacco and drug use.  Limiting alcohol use.  Practicing safe sex.  Taking low doses of aspirin every day.  Taking vitamin and mineral supplements as recommended by your health care provider. What happens during an annual well check? The services and screenings done by your health care provider during your annual well check will depend on your age, overall health, lifestyle risk factors, and family history of disease. Counseling  Your health care provider may ask you questions about your:  Alcohol  use.  Tobacco use.  Drug use.  Emotional well-being.  Home and relationship well-being.  Sexual activity.  Eating habits.  History of falls.  Memory and ability to understand (cognition).  Work and work Statistician. Screening  You may have the following tests or measurements:  Height, weight, and BMI.  Blood pressure.  Lipid and cholesterol levels. These may be checked every 5 years, or more frequently if you are over 25 years old.  Skin check.  Lung cancer screening. You may have this screening every year starting at age 33 if you have a 30-pack-year history of smoking and currently smoke or have quit within the past 15 years.  Fecal occult blood test (FOBT) of the stool. You may have this test every year starting at age 57.  Flexible sigmoidoscopy or colonoscopy. You may have a sigmoidoscopy every 5 years or a colonoscopy every 10 years starting at age 14.  Prostate cancer screening. Recommendations will vary depending on your family history and other risks.  Hepatitis C blood test.  Hepatitis B blood test.  Sexually transmitted disease (STD) testing.  Diabetes screening. This is done by checking your blood sugar (glucose) after you have not eaten for a while (fasting). You may have this done every 1-3 years.  Abdominal aortic aneurysm (AAA) screening. You may need this if you are a current or former smoker.  Osteoporosis. You may be screened starting at age 31 if you are at high risk. Talk with your health care provider about your test results, treatment options, and if necessary, the need for more tests. Vaccines  Your health care provider may recommend certain vaccines, such as:  Influenza vaccine. This is  recommended every year.  Tetanus, diphtheria, and acellular pertussis (Tdap, Td) vaccine. You may need a Td booster every 10 years.  Zoster vaccine. You may need this after age 42.  Pneumococcal 13-valent conjugate (PCV13) vaccine. One dose is  recommended after age 42.  Pneumococcal polysaccharide (PPSV23) vaccine. One dose is recommended after age 57. Talk to your health care provider about which screenings and vaccines you need and how often you need them. This information is not intended to replace advice given to you by your health care provider. Make sure you discuss any questions you have with your health care provider. Document Released: 10/23/2015 Document Revised: 06/15/2016 Document Reviewed: 07/28/2015 Elsevier Interactive Patient Education  2017 [place] Prevention in the Home Falls can cause injuries. They can happen to people of all ages. There are many things you can do to make your home safe and to help prevent falls. What can I do on the outside of my home?  Regularly fix the edges of walkways and driveways and fix any cracks.  Remove anything that might make you trip as you walk through a door, such as a raised step or threshold.  Trim any bushes or trees on the path to your home.  Use bright outdoor lighting.  Clear any walking paths of anything that might make someone trip, such as rocks or tools.  Regularly check to see if handrails are loose or broken. Make sure that both sides of any steps have handrails.  Any raised decks and porches should have guardrails on the edges.  Have any leaves, snow, or ice cleared regularly.  Use sand or salt on walking paths during winter.  Clean up any spills in your garage right away. This includes oil or grease spills. What can I do in the bathroom?  Use night lights.  Install grab bars by the toilet and in the tub and shower. Do not use towel bars as grab bars.  Use non-skid mats or decals in the tub or shower.  If you need to sit down in the shower, use a plastic, non-slip stool.  Keep the floor dry. Clean up any water that spills on the floor as soon as it happens.  Remove soap buildup in the tub or shower regularly.  Attach bath mats  securely with double-sided non-slip rug tape.  Do not have throw rugs and other things on the floor that can make you trip. What can I do in the bedroom?  Use night lights.  Make sure that you have a light by your bed that is easy to reach.  Do not use any sheets or blankets that are too big for your bed. They should not hang down onto the floor.  Have a firm chair that has side arms. You can use this for support while you get dressed.  Do not have throw rugs and other things on the floor that can make you trip. What can I do in the kitchen?  Clean up any spills right away.  Avoid walking on wet floors.  Keep items that you use a lot in easy-to-reach places.  If you need to reach something above you, use a strong step stool that has a grab bar.  Keep electrical cords out of the way.  Do not use floor polish or wax that makes floors slippery. If you must use wax, use non-skid floor wax.  Do not have throw rugs and other things on the floor that can make you trip.  What can I do with my stairs?  Do not leave any items on the stairs.  Make sure that there are handrails on both sides of the stairs and use them. Fix handrails that are broken or loose. Make sure that handrails are as long as the stairways.  Check any carpeting to make sure that it is firmly attached to the stairs. Fix any carpet that is loose or worn.  Avoid having throw rugs at the top or bottom of the stairs. If you do have throw rugs, attach them to the floor with carpet tape.  Make sure that you have a light switch at the top of the stairs and the bottom of the stairs. If you do not have them, ask someone to add them for you. What else can I do to help prevent falls?  Wear shoes that:  Do not have high heels.  Have rubber bottoms.  Are comfortable and fit you well.  Are closed at the toe. Do not wear sandals.  If you use a stepladder:  Make sure that it is fully opened. Do not climb a closed  stepladder.  Make sure that both sides of the stepladder are locked into place.  Ask someone to hold it for you, if possible.  Clearly mark and make sure that you can see:  Any grab bars or handrails.  First and last steps.  Where the edge of each step is.  Use tools that help you move around (mobility aids) if they are needed. These include:  Canes.  Walkers.  Scooters.  Crutches.  Turn on the lights when you go into a dark area. Replace any light bulbs as soon as they burn out.  Set up your furniture so you have a clear path. Avoid moving your furniture around.  If any of your floors are uneven, fix them.  If there are any pets around you, be aware of where they are.  Review your medicines with your doctor. Some medicines can make you feel dizzy. This can increase your chance of falling. Ask your doctor what other things that you can do to help prevent falls. This information is not intended to replace advice given to you by your health care provider. Make sure you discuss any questions you have with your health care provider. Document Released: 07/23/2009 Document Revised: 03/03/2016 Document Reviewed: 10/31/2014 Elsevier Interactive Patient Education  2017 [employer].

## 2020-01-02 NOTE — Progress Notes (Deleted)
{  This SmartText note template is in development as part of the [place].   This template contains optional SmartLists. If no selections are made from an optional SmartList, it will be automatically removed when the note is signed.  Left clicking SmartLists that are in blue, underlined text will show additional information regarding the patient's history.  This text will be automatically removed from this note when it is signed.:[id number]}   Patient: Brad Myers   DOB: 1946/08/25   74 y.o. Male  MRN: [id number] Visit Date: 01/02/2020  Today's Provider: Lelon Huh, MD  Subjective:   No chief complaint on file.  HPI  Hypertension, follow-up:  BP Readings from Last 3 Encounters:  12/27/19 (!) 149/79  07/02/19 116/64  12/26/18 122/72    He was last seen for hypertension 6 months ago.  BP at that visit was 116/64. Management since that visit includes no changes. He reports {excellent/good/fair/poor:19665} compliance with treatment. He {ACTION; IS/IS [id number] having side effects. *** He {is/is not:9024} exercising. He {is/is not:9024} adherent to low salt diet.   Outside blood pressures are ***. He is experiencing {Symptoms; cardiac:12860}.  Patient denies {Symptoms; cardiac:12860}.   Cardiovascular risk factors include advanced age (older than 86 for men, 70 for women), dyslipidemia, hypertension and male gender.  Use of agents associated with hypertension: none.     Weight trend: {trend:16658} Wt Readings from Last 3 Encounters:  12/27/19 127 lb 12.8 oz (58 kg)  07/02/19 127 lb 6.4 oz (57.8 kg)  04/11/19 125 lb (56.7 kg)    Current diet: {diet habits:16563}  ------------------------------------------------------------------------  Lipid/Cholesterol, Follow-up:   Last seen for this 6 months ago.  Management changes since that visit include none. . Last Lipid Panel:    Component Value Date/Time   CHOL 144 07/02/2019 1033   TRIG 149 07/02/2019 1033   HDL 51 07/02/2019 1033   CHOLHDL 2.8 07/02/2019 1033   LDLCALC 67 07/02/2019 1033    Risk factors for vascular disease include hypercholesterolemia and hypertension  He reports {excellent/good/fair/poor:19665} compliance with treatment. He {ACTION; IS/IS [id number] having side effects.  Current symptoms include {Symptoms; diabetes:14075} and have been {Desc; course:15616}. Weight trend: {trend:16658} Prior visit with dietician: no Current diet: {diet habits:16563} Current exercise: {exercise types:16438}  Wt Readings from Last 3 Encounters:  12/27/19 127 lb 12.8 oz (58 kg)  07/02/19 127 lb 6.4 oz (57.8 kg)  04/11/19 125 lb (56.7 kg)    -------------------------------------------------------------------   {Show patient history (optional):23735}   Medications: Outpatient Medications Prior to Visit  Medication Sig Dispense Refill  . atorvastatin (LIPITOR) 40 MG tablet TAKE 1 TABLET(40 MG) BY MOUTH DAILY 30 tablet 12  . Cholecalciferol (VITAMIN D3) 1000 units CAPS Take by mouth daily.    Marland Kitchen NAPROXEN PO Take by mouth at bedtime. One oral    . traZODone (DESYREL) 50 MG tablet TAKE 1 TABLET BY MOUTH EVERY NIGHT AT BEDTIME 30 tablet 0  . triamterene-hydrochlorothiazide (DYAZIDE) 37.5-25 MG capsule TAKE 1 CAPSULE BY MOUTH EVERY DAY 90 capsule 3   No facility-administered medications prior to visit.    Review of Systems  Constitutional: Negative for appetite change, chills and fever.  Respiratory: Negative for chest tightness, shortness of breath and wheezing.   Cardiovascular: Negative for chest pain and palpitations.  Gastrointestinal: Negative for abdominal pain, nausea and vomiting.    {Show previous labs (optional):23736::" "}    Objective:   There were no vitals taken for this visit. There were  no vitals filed for this visit. {Show previous vitals signs (optional):23737::" "}  Physical Exam    No results found for any visits on  01/03/20.    Assessment & Plan:    ***    Lelon Huh, MD  [facility] [phone] (phone) [phone] (fax)  [place]

## 2020-01-03 ENCOUNTER — Ambulatory Visit: Payer: Medicare Other

## 2020-01-03 ENCOUNTER — Encounter: Payer: Medicare Other | Admitting: Family Medicine

## 2020-01-03 NOTE — Patient Instructions (Incomplete)
.   Please review the attached list of medications and notify my office if there are any errors.   . Please bring all of your medications to every appointment so we can make sure that our medication list is the same as yours.   

## 2020-02-25 ENCOUNTER — Other Ambulatory Visit: Payer: Self-pay | Admitting: Family Medicine

## 2020-02-25 DIAGNOSIS — G4700 Insomnia, unspecified: Secondary | ICD-10-CM

## 2020-04-10 ENCOUNTER — Telehealth: Payer: Self-pay

## 2020-04-10 DIAGNOSIS — Z87891 Personal history of nicotine dependence: Secondary | ICD-10-CM

## 2020-04-10 DIAGNOSIS — Z122 Encounter for screening for malignant neoplasm of respiratory organs: Secondary | ICD-10-CM

## 2020-04-10 NOTE — Telephone Encounter (Signed)
Message left notifying patient that it is time to schedule the low dose lung cancer screening CT scan.  Instructed patient to return call to Shawn Perkins at [phone] to verify information prior to CT scan being scheduled.    

## 2020-04-14 NOTE — Addendum Note (Signed)
Addended by: Lieutenant Diego on: 04/14/2020 10:04 AM   Modules accepted: Orders

## 2020-04-14 NOTE — Telephone Encounter (Signed)
Patient has been notified that annual lung cancer screening low dose CT scan is due currently or will be in near future. Confirmed that patient is within the age range of 55-77, and asymptomatic, (no signs or symptoms of lung cancer). Patient denies illness that would prevent curative treatment for lung cancer if found. Verified smoking history, (current, 50.5 pack year). The shared decision making visit was done 07/17/15. Patient is agreeable for CT scan being scheduled.

## 2020-04-20 ENCOUNTER — Ambulatory Visit
Admission: RE | Admit: 2020-04-20 | Discharge: 2020-04-20 | Disposition: A | Discharge status: Home / Self Care | Payer: Medicare Other | Source: Ambulatory Visit | Attending: Oncology | Admitting: Oncology

## 2020-04-20 ENCOUNTER — Other Ambulatory Visit: Payer: Self-pay

## 2020-04-20 DIAGNOSIS — Z122 Encounter for screening for malignant neoplasm of respiratory organs: Secondary | ICD-10-CM | POA: Diagnosis not present

## 2020-04-20 DIAGNOSIS — F17210 Nicotine dependence, cigarettes, uncomplicated: Secondary | ICD-10-CM | POA: Diagnosis not present

## 2020-04-20 DIAGNOSIS — Z87891 Personal history of nicotine dependence: Secondary | ICD-10-CM | POA: Insufficient documentation

## 2020-04-29 ENCOUNTER — Encounter: Payer: Self-pay | Admitting: *Deleted

## 2020-05-07 LAB — MICROALBUMIN, URINE: Microalb, Ur: 9.49

## 2020-05-07 LAB — LIPID PANEL
Cholesterol: 156 (ref 0–200)
HDL: 57 (ref 35–70)
LDL Cholesterol: 76
Triglycerides: 113 (ref 40–160)

## 2020-05-07 LAB — CBC AND DIFFERENTIAL
HCT: 47 (ref 41–53)
Hemoglobin: 15.7 (ref 13.5–17.5)
Platelets: 187 (ref 150–399)
WBC: 8.1

## 2020-05-07 LAB — BASIC METABOLIC PANEL
BUN: 12 (ref 4–21)
Creatinine: 1.1 (ref 0.6–1.3)
Glucose: 78
Potassium: 3.9 (ref 3.4–5.3)
Sodium: 137 (ref 137–147)

## 2020-05-07 LAB — COMPREHENSIVE METABOLIC PANEL
Albumin: 4.2 (ref 3.5–5.0)
Calcium: 9.6 (ref 8.7–10.7)
GFR calc non Af Amer: 69.7

## 2020-05-07 LAB — HEPATIC FUNCTION PANEL
ALT: 26 (ref 10–40)
AST: 12 — AB (ref 14–40)
Alkaline Phosphatase: 63 (ref 25–125)
Bilirubin, Total: 0.7

## 2020-05-18 DIAGNOSIS — L814 Other melanin hyperpigmentation: Secondary | ICD-10-CM | POA: Diagnosis not present

## 2020-05-18 DIAGNOSIS — L570 Actinic keratosis: Secondary | ICD-10-CM | POA: Diagnosis not present

## 2020-05-18 DIAGNOSIS — L738 Other specified follicular disorders: Secondary | ICD-10-CM | POA: Diagnosis not present

## 2020-06-02 DIAGNOSIS — H532 Diplopia: Secondary | ICD-10-CM | POA: Diagnosis not present

## 2020-06-11 DIAGNOSIS — H5034 Intermittent alternating exotropia: Secondary | ICD-10-CM | POA: Diagnosis not present

## 2020-06-11 DIAGNOSIS — H5022 Vertical strabismus, left eye: Secondary | ICD-10-CM | POA: Diagnosis not present

## 2020-07-20 ENCOUNTER — Ambulatory Visit (INDEPENDENT_AMBULATORY_CARE_PROVIDER_SITE_OTHER): Payer: Medicare Other | Admitting: Family Medicine

## 2020-07-20 ENCOUNTER — Encounter: Payer: Self-pay | Admitting: Family Medicine

## 2020-07-20 ENCOUNTER — Other Ambulatory Visit: Payer: Self-pay

## 2020-07-20 VITALS — BP 135/82 | HR 75 | Temp 97.3°F | Resp 16 | Ht 65.0 in | Wt 127.0 lb

## 2020-07-20 DIAGNOSIS — J439 Emphysema, unspecified: Secondary | ICD-10-CM

## 2020-07-20 DIAGNOSIS — F17210 Nicotine dependence, cigarettes, uncomplicated: Secondary | ICD-10-CM

## 2020-07-20 DIAGNOSIS — Z125 Encounter for screening for malignant neoplasm of prostate: Secondary | ICD-10-CM

## 2020-07-20 DIAGNOSIS — K635 Polyp of colon: Secondary | ICD-10-CM

## 2020-07-20 DIAGNOSIS — I700 Atherosclerosis of aorta: Secondary | ICD-10-CM | POA: Diagnosis not present

## 2020-07-20 DIAGNOSIS — E785 Hyperlipidemia, unspecified: Secondary | ICD-10-CM

## 2020-07-20 DIAGNOSIS — I10 Essential (primary) hypertension: Secondary | ICD-10-CM | POA: Diagnosis not present

## 2020-07-20 DIAGNOSIS — Z23 Encounter for immunization: Secondary | ICD-10-CM | POA: Diagnosis not present

## 2020-07-20 NOTE — Patient Instructions (Signed)
.   Please review the attached list of medications and notify my office if there are any errors.   . Please bring all of your medications to every appointment so we can make sure that our medication list is the same as yours.   

## 2020-08-17 ENCOUNTER — Telehealth: Payer: Self-pay

## 2020-08-17 NOTE — Telephone Encounter (Signed)
Copied from [place] [id number]. Topic: General - Other >> Aug 17, 2020 10:44 AM Hinda Lenis D wrote: 01/01/20 second dose Pfizer / he would to schedule his booster / please advise

## 2020-09-10 ENCOUNTER — Other Ambulatory Visit: Payer: Self-pay

## 2020-09-10 ENCOUNTER — Ambulatory Visit (INDEPENDENT_AMBULATORY_CARE_PROVIDER_SITE_OTHER): Payer: Medicare Other

## 2020-09-10 DIAGNOSIS — Z23 Encounter for immunization: Secondary | ICD-10-CM

## 2020-10-04 NOTE — Progress Notes (Signed)
Established patient visit   Patient: Brad Myers   DOB: 1945/12/12   74 y.o. Male  MRN: [id number] Visit Date: 07/20/2020  Today's healthcare provider: Lelon Huh, MD   Chief Complaint  Patient presents with  . Hypertension   Subjective    HPI HPI    Hypertension    Anxiety: Absent.  Blurred vision: Absent.  Chest pain: Absent.  Chest pressure/discomfort: Absent.  Dyspnea: Absent.  Headaches: Absent.  Lower extremity edema: Absent.  Orthopnea: Absent.  Palpitations: Absent.  Paroxysmal nocturnal dyspnea: Absent.  Syncope: Absent.  The patient exercises daily (WALKING).       Last edited by Lazarus Gowda, CMA on 07/20/2020  1:33 PM. (History)      Generally doing well with no complaints today. Tolerating medications with no adverse effects. Home blood pressures are normal.   Also revisited complex cecal polyp. We was referred by Dr. Vicente Males to Dr. Rush Landmark in 2019 to discuss advanced polypectomy, and was subsequently referred to to surgery, Dr. Hassell Done. However patient did not follow through with surgery referral and decided he did not want advanced polypectomy since it may lead to surgery. He is still opposed to both advanced polypectomy and surgical intervention.      Medications: Outpatient Medications Prior to Visit  Medication Sig  . atorvastatin (LIPITOR) 40 MG tablet TAKE 1 TABLET(40 MG) BY MOUTH DAILY  . Cholecalciferol (VITAMIN D3) 1000 units CAPS Take by mouth daily.  Marland Kitchen NAPROXEN PO Take by mouth at bedtime. One oral  . traZODone (DESYREL) 50 MG tablet TAKE 1 TABLET BY MOUTH EVERY NIGHT AT BEDTIME  . triamterene-hydrochlorothiazide (DYAZIDE) 37.5-25 MG capsule TAKE 1 CAPSULE BY MOUTH EVERY DAY   No facility-administered medications prior to visit.    Review of Systems    Objective    BP 135/82 (BP Location: Left Arm, Patient Position: Sitting, Cuff Size: Normal)   Pulse 75   Temp (!) 97.3 F (36.3 C) (Temporal)   Resp 16   Ht 5\' 5"  (1.651 m)    Wt 127 lb (57.6 kg)   SpO2 100%   BMI 21.13 kg/m    Physical Exam    General: Appearance:    Well developed, well nourished male in no acute distress  Eyes:    PERRL, conjunctiva/corneas clear, EOM's intact       Lungs:     Clear to auscultation bilaterally, respirations unlabored  Heart:    Normal heart rate. Normal rhythm. No murmurs, rubs, or gallops.   MS:   All extremities are intact.   Neurologic:   Awake, alert, oriented x 3. No apparent focal neurological           defect.          Assessment & Plan     1. Essential (primary) hypertension Well controlled.  Continue current medications.    2. Pulmonary emphysema, unspecified emphysema type ([place])   3. Aortic atherosclerosis (HCC) Asymptomatic. Compliant with medication.  Continue aggressive risk factor modification.    4. Smoking greater than 30 pack years Encouraged smoking cessation, annual LDCT done in July  5.  Hyperlipidemia, unspecified hyperlipidemia type He is tolerating atorvastatin well with no adverse effects.    8. Need for immunization against influenza  - Flu Vaccine QUAD High Dose(Fluad)  9. Cecal polyp He declined referral for advanced polypectomy and generally surgery. Discussed with Dr. Vicente Males to see if there would be benefit for routine follow up colonoscopy, which he did not  feel would be beneficial. Will discuss referral again with patient at follow up.       The entirety of the information documented in the History of Present Illness, Review of Systems and Physical Exam were personally obtained by me. Portions of this information were initially documented by the CMA and reviewed by me for thoroughness and accuracy.      Lelon Huh, MD  [facility] [phone] (phone) [phone] (fax)  [place]

## 2020-10-22 ENCOUNTER — Encounter: Payer: Self-pay | Admitting: Genetic Counselor

## 2020-12-31 NOTE — Progress Notes (Signed)
Subjective:   Brad Myers is a 75 y.o. male who presents for Medicare Annual/Subsequent preventive examination.  I connected with Brad Myers today by telephone and verified that I am speaking with the correct person using two identifiers. Location patient: home Location provider: work Persons participating in the virtual visit: patient, provider.   I discussed the limitations, risks, security and privacy concerns of performing an evaluation and management service by telephone and the availability of in person appointments. I also discussed with the patient that there may be a patient responsible charge related to this service. The patient expressed understanding and verbally consented to this telephonic visit.    Interactive audio and video telecommunications were attempted between this provider and patient, however failed, due to patient having technical difficulties OR patient did not have access to video capability.  We continued and completed visit with audio only.   Review of Systems    N/A  Cardiac Risk Factors include: advanced age (>59mn, >>74women);smoking/ tobacco exposure;sedentary lifestyle;hypertension;male gender;dyslipidemia     Objective:    There were no vitals filed for this visit. There is no height or weight on file to calculate BMI.  Advanced Directives 01/04/2021 12/30/2019 12/26/2018 07/11/2018 12/11/2017 11/29/2016  Does Patient Have a Medical Advance Directive? Yes Yes Yes No Yes Yes  Type of AParamedicof [place]Living will [place]air[place] HeightsLiving will [place] HillLiving will - [place]Living will [place] RidgeLiving will  Copy of [place]in Chart? Yes - validated most recent copy scanned in chart (See row information) Yes - validated most recent copy scanned in chart (See row information) Yes - validated most recent copy scanned in chart (See row  information) - Yes No - copy requested    Current Medications (verified) Outpatient Encounter Medications as of 01/04/2021  Medication Sig  . atorvastatin (LIPITOR) 40 MG tablet TAKE 1 TABLET(40 MG) BY MOUTH DAILY  . Cholecalciferol (VITAMIN D3) 1000 units CAPS Take by mouth daily.  . traZODone (DESYREL) 50 MG tablet TAKE 1 TABLET BY MOUTH EVERY NIGHT AT BEDTIME  . triamterene-hydrochlorothiazide (DYAZIDE) 37.5-25 MG capsule TAKE 1 CAPSULE BY MOUTH EVERY DAY  . NAPROXEN PO Take by mouth at bedtime. One oral (Patient not taking: Reported on 01/04/2021)   No facility-administered encounter medications on file as of 01/04/2021.    Allergies (verified) Patient has no known allergies.   History: Past Medical History:  Diagnosis Date  . Headache, migraine 06/24/2015  . History of chicken pox 06/24/2015   DID have Chicken Pox. DID have Measles. DID have Mumps.    . Hyperlipidemia   . Hypertension   . Stomach ulcer 06/24/2015   Past Surgical History:  Procedure Laterality Date  . COLONOSCOPY WITH PROPOFOL N/A 07/11/2018   Procedure: COLONOSCOPY WITH PROPOFOL;  Surgeon: AJonathon Bellows MD;  [facility];  Service: Gastroenterology;  Laterality: N/A;  . EYE SURGERY Right 2003  . HERNIA REPAIR Right 2005   Family History  Problem Relation Age of Onset  . Lung cancer Mother        dx 66's died 664 unk if 2 primary or met  . Brain cancer Mother        dx 660's . Diabetes Father   . Coronary artery disease Father   . Colon cancer Neg Hx   . Esophageal cancer Neg Hx   . Inflammatory bowel disease Neg Hx   . Liver disease Neg Hx   .  Pancreatic cancer Neg Hx   . Rectal cancer Neg Hx   . Stomach cancer Neg Hx    Social History   Socioeconomic History  . Marital status: Divorced    Spouse name: Not on file  . Number of children: 1  . Years of education: Not on file  . Highest education level: Some college, no degree  Occupational History  . Occupation: retired  Tobacco  Use  . Smoking status: Current Every Day Smoker    Packs/day: 0.50    Years: 55.00    Pack years: 27.50    Types: Cigarettes  . Smokeless tobacco: Former Systems developer    Types: Snuff, Chew  Vaping Use  . Vaping Use: Never used  Substance and Sexual Activity  . Alcohol use: Yes    Alcohol/week: 12.0 - 20.0 standard drinks    Types: 12 - 20 Cans of beer per week    Comment: drinks 3-4 days a week  . Drug use: No  . Sexual activity: Not on file  Other Topics Concern  . Not on file  Social History Narrative  . Not on file   Social Determinants of Health   Financial Resource Strain: Low Risk   . Difficulty of Paying Living Expenses: Not hard at all  Food Insecurity: No Food Insecurity  . Worried About Charity fundraiser in the Last Year: Never true  . Ran Out of Food in the Last Year: Never true  Transportation Needs: No Transportation Needs  . Lack of Transportation (Medical): No  . Lack of Transportation (Non-Medical): No  Physical Activity: Insufficiently Active  . Days of Exercise per Week: 3 days  . Minutes of Exercise per Session: 30 min  Stress: No Stress Concern Present  . Feeling of Stress : Not at all  Social Connections: Socially Isolated  . Frequency of Communication with Friends and Family: More than three times a week  . Frequency of Social Gatherings with Friends and Family: More than three times a week  . Attends Religious Services: Never  . Active Member of Clubs or Organizations: No  . Attends Archivist Meetings: Never  . Marital Status: Divorced    Tobacco Counseling Ready to quit: No Counseling given: No   Clinical Intake:  Pre-visit preparation completed: Yes  Pain : No/denies pain     Nutritional Risks: None Diabetes: No  How often do you need to have someone help you when you read instructions, pamphlets, or other written materials from your doctor or pharmacy?: 1 - Never  Diabetic? No  Interpreter Needed?: No  Information  entered by :: [facility], LPN   Activities of Daily Living In your present state of health, do you have any difficulty performing the following activities: 01/04/2021 07/20/2020  Hearing? N N  Vision? N N  Difficulty concentrating or making decisions? N N  Walking or climbing stairs? N N  Dressing or bathing? N N  Doing errands, shopping? N N  Preparing Food and eating ? N -  Using the Toilet? N -  In the past six months, have you accidently leaked urine? N -  Do you have problems with loss of bowel control? N -  Managing your Medications? N -  Managing your Finances? N -  Housekeeping or managing your Housekeeping? N -  Some recent data might be hidden    Patient Care Team: [name], MD as PCP - General (Family Medicine) [place], [place] (Optometry) Dingeldein, Remo Lipps, MD (Ophthalmology)  System, Provider Not In (Dermatology)  Indicate any recent Medical Services you may have received from other than [facility] providers in the past year (date may be approximate).     Assessment:   This is a routine wellness examination for Brad Myers.  Hearing/Vision screen No exam data present  Dietary issues and exercise activities discussed: Current Exercise Habits: Home exercise routine, Type of exercise: walking, Time (Minutes): 30, Frequency (Times/Week): 3, Weekly Exercise (Minutes/Week): 90, Intensity: Mild, Exercise limited by: None identified  Goals    . DIET - INCREASE WATER INTAKE     Recommend increasing water intake to 2-4 glasses a day (currently drinking none).    . Quit Smoking     Recommend to continue efforts to reduce smoking habits until no longer smoking.       Depression Screen PHQ 2/9 Scores 01/04/2021 07/20/2020 12/30/2019 12/26/2018 12/11/2017 11/29/2016 11/29/2016  PHQ - 2 Score 0 0 0 0 2 0 0  PHQ- 9 Score - - - - 3 - -    Fall Risk Fall Risk  01/04/2021 07/20/2020 12/30/2019 12/26/2018 12/11/2017  Falls in the past year? 0 0 0 0 No  Number falls in past yr: 0  0 0 - -  Injury with Fall? 0 0 0 - -    FALL RISK PREVENTION PERTAINING TO THE HOME:  Any stairs in or around the home? No  If so, are there any without handrails? No  Home free of loose throw rugs in walkways, pet beds, electrical cords, etc? Yes  Adequate lighting in your home to reduce risk of falls? Yes   ASSISTIVE DEVICES UTILIZED TO PREVENT FALLS:  Life alert? No  Use of a cane, walker or w/c? No  Grab bars in the bathroom? No  Shower chair or bench in shower? No  Elevated toilet seat or a handicapped toilet? No    Cognitive Function: Normal cognitive status assessed by observation by this Nurse Health Advisor. No abnormalities found.      6CIT Screen 11/29/2016  What Year? 0 points  What month? 0 points  What time? 0 points  Count back from 20 0 points  Months in reverse 0 points  Repeat phrase 0 points  Total Score 0    Immunizations Immunization History  Administered Date(s) Administered  . Fluad Quad(high Dose 65+) 07/02/2019, 07/20/2020  . Influenza, High Dose Seasonal PF 06/26/2015, 06/15/2018  . Influenza,inj,Quad PF,6+ Mos 07/24/2017  . PFIZER(Purple Top)SARS-COV-2 Vaccination 12/11/2019, 01/01/2020, 09/10/2020  . Pneumococcal Conjugate-13 07/18/2014  . Pneumococcal Polysaccharide-23 01/23/2012  . Tdap 01/23/2012  . Zoster 05/18/2012    TDAP status: Up to date  Flu Vaccine status: Up to date  Pneumococcal vaccine status: Up to date  Covid-19 vaccine status: Completed vaccines  Qualifies for Shingles Vaccine? Yes   Zostavax completed Yes   Shingrix Completed?: No.    Education has been provided regarding the importance of this vaccine. Patient has been advised to call insurance company to determine out of pocket expense if they have not yet received this vaccine. Advised may also receive vaccine at local pharmacy or Health Dept. Verbalized acceptance and understanding.  Screening Tests Health Maintenance  Topic Date Due  . TETANUS/TDAP   01/22/2022  . COLONOSCOPY (Pts 45-71yr Insurance coverage will need to be confirmed)  07/12/2023  . INFLUENZA VACCINE  Completed  . COVID-19 Vaccine  Completed  . Hepatitis C Screening  Completed  . PNA vac Low Risk Adult  Completed  . HPV VACCINES  Aged Out  Health Maintenance  There are no preventive care reminders to display for this patient.  Colorectal cancer screening: Type of screening: Colonoscopy. Completed 07/11/18. Repeat every 5 years  Lung Cancer Screening: (Low Dose CT Chest recommended if Age 62-80 years, 30 pack-year currently smoking OR have quit w/in 15years.) does qualify however completed this 04/20/20. Repeat yearly.  Additional Screening:  Hepatitis C Screening: Up to date  Vision Screening: Recommended annual ophthalmology exams for early detection of glaucoma and other disorders of the eye. Is the patient up to date with their annual eye exam?  Yes  Who is the provider or what is the name of the office in which the patient attends annual eye exams? Dr Dingeldein @ [place] If pt is not established with a provider, would they like to be referred to a provider to establish care? No .   Dental Screening: Recommended annual dental exams for proper oral hygiene  Community Resource Referral / Chronic Care Management: CRR required this visit?  No   CCM required this visit?  No      Plan:     I have personally reviewed and noted the following in the patient's chart:   . Medical and social history . Use of alcohol, tobacco or illicit drugs  . Current medications and supplements . Functional ability and status . Nutritional status . Physical activity . Advanced directives . List of other physicians . Hospitalizations, surgeries, and ER visits in previous 12 months . Vitals . Screenings to include cognitive, depression, and falls . Referrals and appointments  In addition, I have reviewed and discussed with patient certain preventive protocols, quality  metrics, and best practice recommendations. A written personalized care plan for preventive services as well as general preventive health recommendations were provided to patient.     Makyiah Lie [place], [place]   [id number]   Nurse Notes: None.

## 2021-01-04 ENCOUNTER — Ambulatory Visit (INDEPENDENT_AMBULATORY_CARE_PROVIDER_SITE_OTHER): Payer: Medicare Other

## 2021-01-04 ENCOUNTER — Other Ambulatory Visit: Payer: Self-pay

## 2021-01-04 DIAGNOSIS — Z0000 Encounter for general adult medical examination without abnormal findings: Secondary | ICD-10-CM

## 2021-01-04 NOTE — Patient Instructions (Signed)
[name] , Thank you for taking time to come for your Medicare Wellness Visit. I appreciate your ongoing commitment to your health goals. Please review the following plan we discussed and let me know if I can assist you in the future.   Screening recommendations/referrals: Colonoscopy: Up to date, due 07/2023 Recommended yearly ophthalmology/optometry visit for glaucoma screening and checkup Recommended yearly dental visit for hygiene and checkup  Vaccinations: Influenza vaccine: Done 07/20/20 Pneumococcal vaccine: Completed series Tdap vaccine: Up to date, due 01/2022 Shingles vaccine: Shingrix discussed. Please contact your pharmacy for coverage information.     Advanced directives: Currently on file.  Conditions/risks identified: Smoking cessation discussed today. Recommend increasing water intake to 2-4 glasses a day (currently drinking none).  Next appointment: 01/10/22 @ 10:20 for an AWV. Declined scheduling a follow up with PCP at this time.  Preventive Care 28 Years and Older, Male Preventive care refers to lifestyle choices and visits with your health care provider that can promote health and wellness. What does preventive care include?  A yearly physical exam. This is also called an annual well check.  Dental exams once or twice a year.  Routine eye exams. Ask your health care provider how often you should have your eyes checked.  Personal lifestyle choices, including:  Daily care of your teeth and gums.  Regular physical activity.  Eating a healthy diet.  Avoiding tobacco and drug use.  Limiting alcohol use.  Practicing safe sex.  Taking low doses of aspirin every day.  Taking vitamin and mineral supplements as recommended by your health care provider. What happens during an annual well check? The services and screenings done by your health care provider during your annual well check will depend on your age, overall health, lifestyle risk factors, and family  history of disease. Counseling  Your health care provider may ask you questions about your:  Alcohol use.  Tobacco use.  Drug use.  Emotional well-being.  Home and relationship well-being.  Sexual activity.  Eating habits.  History of falls.  Memory and ability to understand (cognition).  Work and work Statistician. Screening  You may have the following tests or measurements:  Height, weight, and BMI.  Blood pressure.  Lipid and cholesterol levels. These may be checked every 5 years, or more frequently if you are over 47 years old.  Skin check.  Lung cancer screening. You may have this screening every year starting at age 31 if you have a 30-pack-year history of smoking and currently smoke or have quit within the past 15 years.  Fecal occult blood test (FOBT) of the stool. You may have this test every year starting at age 40.  Flexible sigmoidoscopy or colonoscopy. You may have a sigmoidoscopy every 5 years or a colonoscopy every 10 years starting at age 57.  Prostate cancer screening. Recommendations will vary depending on your family history and other risks.  Hepatitis C blood test.  Hepatitis B blood test.  Sexually transmitted disease (STD) testing.  Diabetes screening. This is done by checking your blood sugar (glucose) after you have not eaten for a while (fasting). You may have this done every 1-3 years.  Abdominal aortic aneurysm (AAA) screening. You may need this if you are a current or former smoker.  Osteoporosis. You may be screened starting at age 4 if you are at high risk. Talk with your health care provider about your test results, treatment options, and if necessary, the need for more tests. Vaccines  Your health care  provider may recommend certain vaccines, such as:  Influenza vaccine. This is recommended every year.  Tetanus, diphtheria, and acellular pertussis (Tdap, Td) vaccine. You may need a Td booster every 10 years.  Zoster vaccine.  You may need this after age 77.  Pneumococcal 13-valent conjugate (PCV13) vaccine. One dose is recommended after age 46.  Pneumococcal polysaccharide (PPSV23) vaccine. One dose is recommended after age 21. Talk to your health care provider about which screenings and vaccines you need and how often you need them. This information is not intended to replace advice given to you by your health care provider. Make sure you discuss any questions you have with your health care provider. Document Released: 10/23/2015 Document Revised: 06/15/2016 Document Reviewed: 07/28/2015 Elsevier Interactive Patient Education  2017 [place] Prevention in the Home Falls can cause injuries. They can happen to people of all ages. There are many things you can do to make your home safe and to help prevent falls. What can I do on the outside of my home?  Regularly fix the edges of walkways and driveways and fix any cracks.  Remove anything that might make you trip as you walk through a door, such as a raised step or threshold.  Trim any bushes or trees on the path to your home.  Use bright outdoor lighting.  Clear any walking paths of anything that might make someone trip, such as rocks or tools.  Regularly check to see if handrails are loose or broken. Make sure that both sides of any steps have handrails.  Any raised decks and porches should have guardrails on the edges.  Have any leaves, snow, or ice cleared regularly.  Use sand or salt on walking paths during winter.  Clean up any spills in your garage right away. This includes oil or grease spills. What can I do in the bathroom?  Use night lights.  Install grab bars by the toilet and in the tub and shower. Do not use towel bars as grab bars.  Use non-skid mats or decals in the tub or shower.  If you need to sit down in the shower, use a plastic, non-slip stool.  Keep the floor dry. Clean up any water that spills on the floor as soon  as it happens.  Remove soap buildup in the tub or shower regularly.  Attach bath mats securely with double-sided non-slip rug tape.  Do not have throw rugs and other things on the floor that can make you trip. What can I do in the bedroom?  Use night lights.  Make sure that you have a light by your bed that is easy to reach.  Do not use any sheets or blankets that are too big for your bed. They should not hang down onto the floor.  Have a firm chair that has side arms. You can use this for support while you get dressed.  Do not have throw rugs and other things on the floor that can make you trip. What can I do in the kitchen?  Clean up any spills right away.  Avoid walking on wet floors.  Keep items that you use a lot in easy-to-reach places.  If you need to reach something above you, use a strong step stool that has a grab bar.  Keep electrical cords out of the way.  Do not use floor polish or wax that makes floors slippery. If you must use wax, use non-skid floor wax.  Do not have throw  rugs and other things on the floor that can make you trip. What can I do with my stairs?  Do not leave any items on the stairs.  Make sure that there are handrails on both sides of the stairs and use them. Fix handrails that are broken or loose. Make sure that handrails are as long as the stairways.  Check any carpeting to make sure that it is firmly attached to the stairs. Fix any carpet that is loose or worn.  Avoid having throw rugs at the top or bottom of the stairs. If you do have throw rugs, attach them to the floor with carpet tape.  Make sure that you have a light switch at the top of the stairs and the bottom of the stairs. If you do not have them, ask someone to add them for you. What else can I do to help prevent falls?  Wear shoes that:  Do not have high heels.  Have rubber bottoms.  Are comfortable and fit you well.  Are closed at the toe. Do not wear sandals.  If  you use a stepladder:  Make sure that it is fully opened. Do not climb a closed stepladder.  Make sure that both sides of the stepladder are locked into place.  Ask someone to hold it for you, if possible.  Clearly mark and make sure that you can see:  Any grab bars or handrails.  First and last steps.  Where the edge of each step is.  Use tools that help you move around (mobility aids) if they are needed. These include:  Canes.  Walkers.  Scooters.  Crutches.  Turn on the lights when you go into a dark area. Replace any light bulbs as soon as they burn out.  Set up your furniture so you have a clear path. Avoid moving your furniture around.  If any of your floors are uneven, fix them.  If there are any pets around you, be aware of where they are.  Review your medicines with your doctor. Some medicines can make you feel dizzy. This can increase your chance of falling. Ask your doctor what other things that you can do to help prevent falls. This information is not intended to replace advice given to you by your health care provider. Make sure you discuss any questions you have with your health care provider. Document Released: 07/23/2009 Document Revised: 03/03/2016 Document Reviewed: 10/31/2014 Elsevier Interactive Patient Education  2017 [employer].

## 2021-02-10 ENCOUNTER — Encounter: Payer: Self-pay | Admitting: Emergency Medicine

## 2021-02-10 ENCOUNTER — Emergency Department
Admission: EM | Admit: 2021-02-10 | Discharge: 2021-02-10 | Disposition: A | Discharge status: Home / Self Care | Payer: Medicare Other | Attending: Emergency Medicine | Admitting: Emergency Medicine

## 2021-02-10 ENCOUNTER — Emergency Department: Payer: Medicare Other

## 2021-02-10 ENCOUNTER — Other Ambulatory Visit: Payer: Self-pay

## 2021-02-10 DIAGNOSIS — M19032 Primary osteoarthritis, left wrist: Secondary | ICD-10-CM | POA: Insufficient documentation

## 2021-02-10 DIAGNOSIS — F17210 Nicotine dependence, cigarettes, uncomplicated: Secondary | ICD-10-CM | POA: Diagnosis not present

## 2021-02-10 DIAGNOSIS — M25532 Pain in left wrist: Secondary | ICD-10-CM | POA: Diagnosis present

## 2021-02-10 DIAGNOSIS — I10 Essential (primary) hypertension: Secondary | ICD-10-CM | POA: Insufficient documentation

## 2021-02-10 DIAGNOSIS — M1812 Unilateral primary osteoarthritis of first carpometacarpal joint, left hand: Secondary | ICD-10-CM | POA: Diagnosis not present

## 2021-02-10 DIAGNOSIS — R52 Pain, unspecified: Secondary | ICD-10-CM

## 2021-02-10 DIAGNOSIS — Z79899 Other long term (current) drug therapy: Secondary | ICD-10-CM | POA: Insufficient documentation

## 2021-02-10 MED ORDER — MELOXICAM 7.5 MG PO TABS: 7.5000 mg | ORAL_TABLET | Freq: Every day | ORAL | 0 refills | Status: DC

## 2021-02-10 NOTE — ED Notes (Signed)
Pt has been provided with discharge instructions. Pt denies any questions or concerns at this time. Pt verbalizes understanding for follow up care and d/c.  VSS.  Pt left department with all belongings.  

## 2021-02-10 NOTE — ED Provider Notes (Signed)
[facility] Emergency Department Provider Note   ____________________________________________   Event Date/Time   First MD Initiated Contact with Patient 02/10/21 1114     (approximate)  I have reviewed the triage vital signs and the nursing notes.   HISTORY  Chief Complaint Hand Pain   HPI Brad Myers is a 75 y.o. male presents to the ED with complaint of left wrist pain.  Patient states he has been taking naproxen with some relief of his pain.  Basically he wants to know if he needs surgery as he is having difficulty playing golf.  Denies any recent injury.  Currently rates his pain as 6 out of 10.       Past Medical History:  Diagnosis Date  . Headache, migraine 06/24/2015  . History of chicken pox 06/24/2015   DID have Chicken Pox. DID have Measles. DID have Mumps.    . Hyperlipidemia   . Hypertension   . Stomach ulcer 06/24/2015    Patient Active Problem List   Diagnosis Date Noted  . Right inguinal hernia 12/27/2019  . Osteoarthritis of carpometacarpal (CMC) joint of thumb 12/26/2019  . Aortic atherosclerosis ([place]) 04/15/2019  . Emphysema lung ([place]) 04/15/2019  . Genetic testing 10/19/2018  . Family history of brain cancer   . Family history of lung cancer   . Cecal polyp 09/19/2018  . Insomnia 04/21/2017  . Coronary artery calcification seen on CAT scan 07/20/2015  . Personal history of tobacco use, presenting hazards to health 07/09/2015  . Smoking greater than 30 pack years 06/26/2015  . History of adenomatous polyp of colon 06/24/2015  . Essential (primary) hypertension 06/24/2015  . GERD (gastroesophageal reflux disease) 06/24/2015  . HLD (hyperlipidemia) 06/24/2015  . Tenosynovitis of hand 06/24/2015    Past Surgical History:  Procedure Laterality Date  . COLONOSCOPY WITH PROPOFOL N/A 07/11/2018   Procedure: COLONOSCOPY WITH PROPOFOL;  Surgeon: [name], MD;  Location[facility] ENDOSCOPY;  Service: Gastroenterology;   Laterality: N/A;  . EYE SURGERY Right 2003  . HERNIA REPAIR Right 2005    Prior to Admission medications   Medication Sig Start Date End Date Taking? Authorizing Provider  meloxicam (MOBIC) 7.5 MG tablet Take 1 tablet (7.5 mg total) by mouth daily. 02/10/21 02/10/22 Yes Brya Simerly L, PA-C  atorvastatin (LIPITOR) 40 MG tablet TAKE 1 TABLET(40 MG) BY MOUTH DAILY 05/31/19   [name], MD  Cholecalciferol (VITAMIN D3) 1000 units CAPS Take by mouth daily.    [provider]  traZODone (DESYREL) 50 MG tablet TAKE 1 TABLET BY MOUTH EVERY NIGHT AT BEDTIME 02/25/20   [name], MD  triamterene-hydrochlorothiazide (DYAZIDE) 37.5-25 MG capsule TAKE 1 CAPSULE BY MOUTH EVERY DAY 10/06/19   [name], MD    Allergies Patient has no known allergies.  Family History  Problem Relation Age of Onset  . Lung cancer Mother        dx 33's, died 42. unk if 2 primary or met  . Brain cancer Mother        dx 7's  . Diabetes Father   . Coronary artery disease Father   . Colon cancer Neg Hx   . Esophageal cancer Neg Hx   . Inflammatory bowel disease Neg Hx   . Liver disease Neg Hx   . Pancreatic cancer Neg Hx   . Rectal cancer Neg Hx   . Stomach cancer Neg Hx     Social History Social History   Tobacco Use  .  Smoking status: Current Every Day Smoker    Packs/day: 0.50    Years: 55.00    Pack years: 27.50    Types: Cigarettes  . Smokeless tobacco: Former Systems developer    Types: Snuff, Chew  Vaping Use  . Vaping Use: Never used  Substance Use Topics  . Alcohol use: Yes    Alcohol/week: 12.0 - 20.0 standard drinks    Types: 12 - 20 Cans of beer per week    Comment: drinks 3-4 days a week  . Drug use: No    Review of Systems Constitutional: No fever/chills Eyes: No visual changes. Cardiovascular: Denies chest pain. Respiratory: Denies shortness of breath. Gastrointestinal: No abdominal pain.   Musculoskeletal: Positive for left wrist pain. Skin: Negative for  rash. Neurological: Negative for headaches, focal weakness or numbness. ____________________________________________   PHYSICAL EXAM:  VITAL SIGNS: ED Triage Vitals  Enc Vitals Group     BP 02/10/21 1014 (!) 144/76     Pulse Rate 02/10/21 1014 72     Resp 02/10/21 1014 18     Temp 02/10/21 1014 97.9 F (36.6 C)     Temp Source 02/10/21 1014 Oral     SpO2 02/10/21 1014 95 %     Weight 02/10/21 1006 126 lb 15.8 oz (57.6 kg)     Height 02/10/21 1006 5' 5"  (1.651 m)     Head Circumference --      Peak Flow --      Pain Score 02/10/21 1005 6     Pain Loc --      Pain Edu? --      Excl. in [place]? --     Constitutional: Alert and oriented. Well appearing and in no acute distress. Eyes: Conjunctivae are normal.  Head: Atraumatic. Neck: No stridor.   Cardiovascular: Normal rate, regular rhythm. Grossly normal heart sounds.  Good peripheral circulation. Respiratory: Normal respiratory effort.  No retractions. Lungs CTAB. Musculoskeletal: On examination of the rest wrist there is obvious degenerative changes but no acute injury or skin discoloration/abrasions are seen.  Moderate tenderness on palpation at the base of the first metacarpal area.  No edema or erythema is present.  Pulses present.  Skin is intact.  Is able to move digits distally with minimal restriction.  Also digits show some degenerative changes as well consistent with age-related osteoarthritis. Neurologic:  Normal speech and language. No gross focal neurologic deficits are appreciated. No gait instability. Skin:  Skin is warm, dry and intact. No rash noted. Psychiatric: Mood and affect are normal. Speech and behavior are normal.  ____________________________________________   LABS (all labs ordered are listed, but only abnormal results are displayed)  Labs Reviewed - No data to display   RADIOLOGY I, Johnn Hai, personally viewed and evaluated these images (plain radiographs) as part of my medical decision  making, as well as reviewing the written report by the radiologist.   Official radiology report(s): DG Wrist Complete Left  Result Date: 02/10/2021 CLINICAL DATA:  Wrist pain beginning last week after playing golf. EXAM: LEFT WRIST - COMPLETE 3+ VIEW COMPARISON:  None. FINDINGS: There is no evidence of fracture or dislocation. First CMC osteoarthritic joint space narrowing. Soft tissues are unremarkable. IMPRESSION: 1. No acute or focal finding. 2. First CMC degenerative joint narrowing. Electronically Signed   By: Monte Fantasia M.D.   On: 02/10/2021 10:51    ____________________________________________   PROCEDURES  Procedure(s) performed (including Critical Care):  Procedures   ____________________________________________   INITIAL IMPRESSION /  ASSESSMENT AND PLAN / ED COURSE  As part of my medical decision making, I reviewed the following data within the electronic MEDICAL RECORD NUMBER Notes from prior ED visits and [place] Controlled Substance Database  75 year old male presents to the ED with complaint of left wrist pain that has gradually gotten worse over period time.  Patient states he has taken Aleve for this.  His basic concern is if he needs surgery he wants to do it so that he can play golf.  He will discontinue taking the Aleve twice daily and switch to meloxicam 7.5 mg 1 daily with food.  We discussed findings on his x-ray.  He will call make an appointment with Dr. Harlow Mares who is on-call for orthopedics and contact information was given to him.  ____________________________________________   FINAL CLINICAL IMPRESSION(S) / ED DIAGNOSES  Final diagnoses:  Osteoarthritis of left wrist, unspecified osteoarthritis type     ED Discharge Orders         Ordered    meloxicam (MOBIC) 7.5 MG tablet  Daily        02/10/21 1130          *Please note:  Brad Myers was evaluated in Emergency Department on 02/10/2021 for the symptoms described in the history of present illness. He  was evaluated in the context of the global COVID-19 pandemic, which necessitated consideration that the patient might be at risk for infection with the SARS-CoV-2 virus that causes COVID-19. Institutional protocols and algorithms that pertain to the evaluation of patients at risk for COVID-19 are in a state of rapid change based on information released by regulatory bodies including the CDC and federal and state organizations. These policies and algorithms were followed during the patient's care in the ED.  Some ED evaluations and interventions may be delayed as a result of limited staffing during and the pandemic.*   Note:  This document was prepared using Dragon voice recognition software and may include unintentional dictation errors.    Johnn Hai, PA-C 02/10/21 1151    Duffy Bruce, MD 02/13/21 1504

## 2021-02-10 NOTE — ED Triage Notes (Addendum)
C/O left wrist pain.  States injured wrist a long time ago, but pain has worsened.  States "I just want to know if I need surgery or not so I can play golf".

## 2021-02-10 NOTE — Discharge Instructions (Addendum)
Follow-up with your primary care provider or Dr. Harlow Mares who is the orthopedist on-call.  His contact information and address is listed on your discharge papers.  Discontinue taking the naproxen and begin taking meloxicam to see if this gives you better relief than the naproxen.  This medication is once a day and to be taken with food.

## 2021-02-23 DIAGNOSIS — H25813 Combined forms of age-related cataract, bilateral: Secondary | ICD-10-CM | POA: Diagnosis not present

## 2021-02-25 DIAGNOSIS — M182 Bilateral post-traumatic osteoarthritis of first carpometacarpal joints: Secondary | ICD-10-CM | POA: Diagnosis not present

## 2021-03-04 ENCOUNTER — Other Ambulatory Visit: Payer: Self-pay | Admitting: Specialist

## 2021-03-04 NOTE — H&P (Signed)
PREOPERATIVE H&P  Chief Complaint: M18.12 osteoarth of first carpometacarp joints  HPI: Brad Myers is a 75 y.o. male who presents for preoperative history and physical with a diagnosis of M18.12 osteoarth of first carpometacarp joints. Symptoms are rated as moderate to severe, and have been worsening.  The patient has had this condition for several years.  He has failed conservative treatment including cortisone injections, NSAIDs, and splinting.  This is significantly impairing activities of daily living.  He has elected for surgical management.   Past Medical History:  Diagnosis Date  . Headache, migraine 06/24/2015  . History of chicken pox 06/24/2015   DID have Chicken Pox. DID have Measles. DID have Mumps.    . Hyperlipidemia   . Hypertension   . Stomach ulcer 06/24/2015   Past Surgical History:  Procedure Laterality Date  . COLONOSCOPY WITH PROPOFOL N/A 07/11/2018   Procedure: COLONOSCOPY WITH PROPOFOL;  Surgeon: [name], MD;  Location: [facility] ENDOSCOPY;  Service: Gastroenterology;  Laterality: N/A;  . EYE SURGERY Right 2003  . HERNIA REPAIR Right 2005   Social History   Socioeconomic History  . Marital status: Divorced    Spouse name: Not on file  . Number of children: 1  . Years of education: Not on file  . Highest education level: Some college, no degree  Occupational History  . Occupation: retired  Tobacco Use  . Smoking status: Current Every Day Smoker    Packs/day: 0.50    Years: 55.00    Pack years: 27.50    Types: Cigarettes  . Smokeless tobacco: Former Systems developer    Types: Snuff, Chew  Vaping Use  . Vaping Use: Never used  Substance and Sexual Activity  . Alcohol use: Yes    Alcohol/week: 12.0 - 20.0 standard drinks    Types: 12 - 20 Cans of beer per week    Comment: drinks 3-4 days a week  . Drug use: No  . Sexual activity: Not on file  Other Topics Concern  . Not on file  Social History Narrative  . Not on file   Social Determinants of Health    Financial Resource Strain: Low Risk   . Difficulty of Paying Living Expenses: Not hard at all  Food Insecurity: No Food Insecurity  . Worried About Charity fundraiser in the Last Year: Never true  . Ran Out of Food in the Last Year: Never true  Transportation Needs: No Transportation Needs  . Lack of Transportation (Medical): No  . Lack of Transportation (Non-Medical): No  Physical Activity: Insufficiently Active  . Days of Exercise per Week: 3 days  . Minutes of Exercise per Session: 30 min  Stress: No Stress Concern Present  . Feeling of Stress : Not at all  Social Connections: Socially Isolated  . Frequency of Communication with Friends and Family: More than three times a week  . Frequency of Social Gatherings with Friends and Family: More than three times a week  . Attends Religious Services: Never  . Active Member of Clubs or Organizations: No  . Attends Archivist Meetings: Never  . Marital Status: Divorced   Family History  Problem Relation Age of Onset  . Lung cancer Mother        dx 51's, died 61. unk if 2 primary or met  . Brain cancer Mother        dx 94's  . Diabetes Father   . Coronary artery disease Father   . Colon cancer Neg  Hx   . Esophageal cancer Neg Hx   . Inflammatory bowel disease Neg Hx   . Liver disease Neg Hx   . Pancreatic cancer Neg Hx   . Rectal cancer Neg Hx   . Stomach cancer Neg Hx    No Known Allergies Prior to Admission medications   Medication Sig Start Date End Date Taking? Authorizing Provider  atorvastatin (LIPITOR) 40 MG tablet TAKE 1 TABLET(40 MG) BY MOUTH DAILY 05/31/19   [name], MD  Cholecalciferol (VITAMIN D3) 1000 units CAPS Take by mouth daily.    [provider]  meloxicam (MOBIC) 7.5 MG tablet Take 1 tablet (7.5 mg total) by mouth daily. 02/10/21 02/10/22  Johnn Hai, PA-C  traZODone (DESYREL) 50 MG tablet TAKE 1 TABLET BY MOUTH EVERY NIGHT AT BEDTIME 02/25/20   [name], MD   triamterene-hydrochlorothiazide (DYAZIDE) 37.5-25 MG capsule TAKE 1 CAPSULE BY MOUTH EVERY DAY 10/06/19   [name], MD     Positive ROS: All other systems have been reviewed and were otherwise negative with the exception of those mentioned in the HPI and as above.  Physical Exam: General: Alert, no acute distress Cardiovascular: No pedal edema. Heart is regular and without murmur.  Respiratory: No cyanosis, no use of accessory musculature. Lungs are clear. GI: No organomegaly, abdomen is soft and non-tender Skin: No lesions in the area of chief complaint Neurologic: Sensation intact distally Psychiatric: Patient is competent for consent with normal mood and affect Lymphatic: No axillary or cervical lymphadenopathy  MUSCULOSKELETAL: Marked tenderness and crepitus at the left thumb CMC joint.  The metacarpal is subluxed.  There is pinch is weak.  Neurovascular status is good.  Median nerve compression is negative.  Range of motion of the fingers is otherwise normal.  Finkelstein's is negative.  Assessment: M18.2 Bi post-trauma osteoarth of first carpometacarp joints  Plan: Plan for Procedure(s): ARTHROPLASTY, INTERPOSITION/INTERCARPAL/CMC JOINTS (SURG)  The risks benefits and alternatives were discussed with the patient including but not limited to the risks of nonoperative treatment, versus surgical intervention including infection, bleeding, nerve injury,  blood clots, cardiopulmonary complications, morbidity, mortality, among others, and they were willing to proceed.   Park Breed, MD [phone]   03/04/2021 4:57 PM

## 2021-03-09 ENCOUNTER — Other Ambulatory Visit
Admission: RE | Admit: 2021-03-09 | Discharge: 2021-03-09 | Disposition: A | Discharge status: Home / Self Care | Payer: Medicare Other | Source: Ambulatory Visit | Attending: Specialist | Admitting: Specialist

## 2021-03-09 ENCOUNTER — Other Ambulatory Visit: Payer: Self-pay

## 2021-03-09 HISTORY — DX: Unspecified osteoarthritis, unspecified site: M19.90

## 2021-03-09 NOTE — Patient Instructions (Addendum)
Your procedure is scheduled on: 03/11/21 - Thursday Report to the Registration Desk on the 1st floor of the [facility]. To find out your arrival time, please call [phone] between 1PM - 3PM on: 03/10/21 - Wednesday  Report to [facility] at 930 am on 03/10/21 for Labs/EKG and Bag.  REMEMBER: Instructions that are not followed completely may result in serious medical risk, up to and including death; or upon the discretion of your surgeon and anesthesiologist your surgery may need to be rescheduled.  Do not eat food or drink any fluids after midnight the night before surgery.  No gum chewing, lozengers or hard candies.  TAKE THESE MEDICATIONS THE MORNING OF SURGERY WITH A SIP OF WATER: NONE  One week prior to surgery: stop taking MObic 03/09/21 Stop Anti-inflammatories (NSAIDS) such as Advil, Aleve, Ibuprofen, Motrin, Naproxen, Naprosyn and Aspirin based products such as Excedrin, Goodys Powder, BC Powder.  Stop ANY OVER THE COUNTER supplements until after surgery.  You may take Tylenol if needed for pain up until the day of surgery.  No Alcohol for 24 hours before or after surgery.  No Smoking including e-cigarettes for 24 hours prior to surgery.  No chewable tobacco products for at least 6 hours prior to surgery.  No nicotine patches on the day of surgery.  Do not use any "recreational" drugs for at least a week prior to your surgery.  Please be advised that the combination of cocaine and anesthesia may have negative outcomes, up to and including death. If you test positive for cocaine, your surgery will be cancelled.  On the morning of surgery brush your teeth with toothpaste and water, you may rinse your mouth with mouthwash if you wish. Do not swallow any toothpaste or mouthwash.  Do not wear jewelry, make-up, hairpins, clips or nail polish.  Do not wear lotions, powders, or perfumes.   Do not shave body from the neck down 48 hours prior to surgery just in case you  cut yourself which could leave a site for infection.  Also, freshly shaved skin may become irritated if using the CHG soap.  Contact lenses, hearing aids and dentures may not be worn into surgery.  Do not bring valuables to the hospital. [facility] is not responsible for any missing/lost belongings or valuables.   Use CHG Soap or wipes as directed on instruction sheet.  Notify your doctor if there is any change in your medical condition (cold, fever, infection).  Wear comfortable clothing (specific to your surgery type) to the hospital.  Plan for stool softeners for home use; pain medications have a tendency to cause constipation. You can also help prevent constipation by eating foods high in fiber such as fruits and vegetables and drinking plenty of fluids as your diet allows.  After surgery, you can help prevent lung complications by doing breathing exercises.  Take deep breaths and cough every 1-2 hours. Your doctor may order a device called an Incentive Spirometer to help you take deep breaths. When coughing or sneezing, hold a pillow firmly against your incision with both hands. This is called "splinting." Doing this helps protect your incision. It also decreases belly discomfort.  If you are being admitted to the hospital overnight, leave your suitcase in the car. After surgery it may be brought to your room.  If you are being discharged the day of surgery, you will not be allowed to drive home. You will need a responsible adult (18 years or older) to drive you  home and stay with you that night.   If you are taking public transportation, you will need to have a responsible adult (18 years or older) with you. Please confirm with your physician that it is acceptable to use public transportation.   Please call the [place] [facility] at [phone] if you have any questions about these instructions.  Surgery Visitation Policy:  Patients undergoing a surgery or  procedure may have one family member or support person with them as long as that person is not COVID-19 positive or experiencing its symptoms.  That person may remain in the waiting area during the procedure.  Inpatient Visitation:    Visiting hours are 7 a.m. to 8 p.m. Inpatients will be allowed two visitors daily. The visitors may change each day during the patient's stay. No visitors under the age of 41. Any visitor under the age of 73 must be accompanied by an adult. The visitor must pass COVID-19 screenings, use hand sanitizer when entering and exiting the patient's room and wear a mask at all times, including in the patient's room. Patients must also wear a mask when staff or their visitor are in the room. Masking is required regardless of vaccination status.

## 2021-03-10 ENCOUNTER — Other Ambulatory Visit
Admission: RE | Admit: 2021-03-10 | Discharge: 2021-03-10 | Disposition: A | Discharge status: Home / Self Care | Payer: Medicare Other | Source: Ambulatory Visit | Attending: Specialist | Admitting: Specialist

## 2021-03-10 DIAGNOSIS — I10 Essential (primary) hypertension: Secondary | ICD-10-CM | POA: Diagnosis not present

## 2021-03-10 DIAGNOSIS — Z01818 Encounter for other preprocedural examination: Secondary | ICD-10-CM | POA: Insufficient documentation

## 2021-03-10 LAB — CBC
HCT: 45.6 % (ref 39.0–52.0)
Hemoglobin: 15.8 g/dL (ref 13.0–17.0)
MCH: 32.6 pg (ref 26.0–34.0)
MCHC: 34.6 g/dL (ref 30.0–36.0)
MCV: 94 fL (ref 80.0–100.0)
Platelets: 179 10*3/uL (ref 150–400)
RBC: 4.85 MIL/uL (ref 4.22–5.81)
RDW: 13.4 % (ref 11.5–15.5)
WBC: 8.1 10*3/uL (ref 4.0–10.5)
nRBC: 0 % (ref 0.0–0.2)

## 2021-03-10 LAB — BASIC METABOLIC PANEL
Anion gap: 10 (ref 5–15)
BUN: 14 mg/dL (ref 8–23)
CO2: 26 mmol/L (ref 22–32)
Calcium: 9.4 mg/dL (ref 8.9–10.3)
Chloride: 101 mmol/L (ref 98–111)
Creatinine, Ser: 0.93 mg/dL (ref 0.61–1.24)
GFR, Estimated: >60 mL/min (ref >60)
Glucose, Bld: 103 mg/dL — ABNORMAL HIGH (ref 70–99)
Potassium: 3.9 mmol/L (ref 3.5–5.1)
Sodium: 137 mmol/L (ref 135–145)

## 2021-03-11 ENCOUNTER — Ambulatory Visit
Admission: RE | Admit: 2021-03-11 | Discharge: 2021-03-11 | Disposition: A | Discharge status: Home / Self Care | Payer: Medicare Other | Attending: Specialist | Admitting: Specialist

## 2021-03-11 ENCOUNTER — Encounter: Payer: Self-pay | Admitting: Specialist

## 2021-03-11 ENCOUNTER — Ambulatory Visit: Payer: Medicare Other

## 2021-03-11 ENCOUNTER — Encounter
Admission: RE | Disposition: A | Discharge status: Home / Self Care | Payer: Self-pay | Source: Home / Self Care | Attending: Specialist

## 2021-03-11 ENCOUNTER — Other Ambulatory Visit: Payer: Self-pay

## 2021-03-11 ENCOUNTER — Ambulatory Visit: Payer: Medicare Other | Admitting: Anesthesiology

## 2021-03-11 DIAGNOSIS — F17210 Nicotine dependence, cigarettes, uncomplicated: Secondary | ICD-10-CM | POA: Insufficient documentation

## 2021-03-11 DIAGNOSIS — Z8719 Personal history of other diseases of the digestive system: Secondary | ICD-10-CM | POA: Diagnosis not present

## 2021-03-11 DIAGNOSIS — E785 Hyperlipidemia, unspecified: Secondary | ICD-10-CM | POA: Insufficient documentation

## 2021-03-11 DIAGNOSIS — Z79899 Other long term (current) drug therapy: Secondary | ICD-10-CM | POA: Diagnosis not present

## 2021-03-11 DIAGNOSIS — I10 Essential (primary) hypertension: Secondary | ICD-10-CM | POA: Insufficient documentation

## 2021-03-11 DIAGNOSIS — Z8711 Personal history of peptic ulcer disease: Secondary | ICD-10-CM | POA: Insufficient documentation

## 2021-03-11 DIAGNOSIS — Z791 Long term (current) use of non-steroidal anti-inflammatories (NSAID): Secondary | ICD-10-CM | POA: Diagnosis not present

## 2021-03-11 DIAGNOSIS — M1812 Unilateral primary osteoarthritis of first carpometacarpal joint, left hand: Secondary | ICD-10-CM | POA: Diagnosis not present

## 2021-03-11 DIAGNOSIS — Z8249 Family history of ischemic heart disease and other diseases of the circulatory system: Secondary | ICD-10-CM | POA: Diagnosis not present

## 2021-03-11 HISTORY — PX: CARPOMETACARPAL (CMC) FUSION OF THUMB: SHX6290

## 2021-03-11 SURGERY — CARPOMETACARPAL (CMC) FUSION OF THUMB
Anesthesia: General | Site: Thumb | Laterality: Left

## 2021-03-11 MED ORDER — LIDOCAINE HCL URETHRAL/MUCOSAL 2 % EX GEL
CUTANEOUS | Status: AC
  Filled 2021-03-11: qty 5

## 2021-03-11 MED ORDER — FAMOTIDINE 20 MG PO TABS
ORAL_TABLET | ORAL | Status: AC
  Administered 2021-03-11: 20 mg via ORAL
  Filled 2021-03-11: qty 1

## 2021-03-11 MED ORDER — ONDANSETRON HCL 4 MG/2ML IJ SOLN
INTRAMUSCULAR | Status: AC
  Filled 2021-03-11: qty 2

## 2021-03-11 MED ORDER — FENTANYL CITRATE (PF) 100 MCG/2ML IJ SOLN
INTRAMUSCULAR | Status: AC
  Filled 2021-03-11: qty 2

## 2021-03-11 MED ORDER — CHLORHEXIDINE GLUCONATE 0.12 % MT SOLN: 15.0000 mL | Freq: Once | OROMUCOSAL | Status: AC

## 2021-03-11 MED ORDER — MELOXICAM 7.5 MG PO TABS: 15.0000 mg | ORAL_TABLET | ORAL | Status: AC

## 2021-03-11 MED ORDER — DEXAMETHASONE SODIUM PHOSPHATE 10 MG/ML IJ SOLN
INTRAMUSCULAR | Status: DC | PRN
  Administered 2021-03-11: 8 mg via INTRAVENOUS

## 2021-03-11 MED ORDER — LIDOCAINE HCL (CARDIAC) PF 100 MG/5ML IV SOSY
PREFILLED_SYRINGE | INTRAVENOUS | Status: DC | PRN
  Administered 2021-03-11: 80 mg via INTRAVENOUS

## 2021-03-11 MED ORDER — BUPIVACAINE HCL (PF) 0.5 % IJ SOLN
INTRAMUSCULAR | Status: AC
  Filled 2021-03-11: qty 30

## 2021-03-11 MED ORDER — FAMOTIDINE 20 MG PO TABS: 20.0000 mg | ORAL_TABLET | Freq: Once | ORAL | Status: AC

## 2021-03-11 MED ORDER — NEOMYCIN-POLYMYXIN B GU 40-200000 IR SOLN
Status: AC
  Filled 2021-03-11: qty 2

## 2021-03-11 MED ORDER — HYDROCODONE-ACETAMINOPHEN 5-325 MG PO TABS: 1.0000 | ORAL_TABLET | Freq: Four times a day (QID) | ORAL | 0 refills | Status: DC | PRN

## 2021-03-11 MED ORDER — LACTATED RINGERS IV SOLN: INTRAVENOUS | Status: DC | PRN

## 2021-03-11 MED ORDER — GABAPENTIN 300 MG PO CAPS: 300.0000 mg | ORAL_CAPSULE | ORAL | Status: AC

## 2021-03-11 MED ORDER — PROPOFOL 10 MG/ML IV BOLUS
INTRAVENOUS | Status: DC | PRN
  Administered 2021-03-11: 150 mg via INTRAVENOUS

## 2021-03-11 MED ORDER — PROPOFOL 10 MG/ML IV BOLUS
INTRAVENOUS | Status: AC
  Filled 2021-03-11: qty 20

## 2021-03-11 MED ORDER — ONDANSETRON HCL 4 MG/2ML IJ SOLN: 4.0000 mg | Freq: Once | INTRAMUSCULAR | Status: DC | PRN

## 2021-03-11 MED ORDER — MELOXICAM 15 MG PO TABS
15.0000 mg | ORAL_TABLET | Freq: Every day | ORAL | 3 refills | Status: DC
Start: 2021-03-11 — End: 2021-06-08

## 2021-03-11 MED ORDER — NEOMYCIN-POLYMYXIN B GU 40-200000 IR SOLN
Status: DC | PRN
  Administered 2021-03-11: 2 mL

## 2021-03-11 MED ORDER — CHLORHEXIDINE GLUCONATE 0.12 % MT SOLN
OROMUCOSAL | Status: AC
  Administered 2021-03-11: 15 mL via OROMUCOSAL
  Filled 2021-03-11: qty 15

## 2021-03-11 MED ORDER — CLINDAMYCIN PHOSPHATE 600 MG/50ML IV SOLN
INTRAVENOUS | Status: AC
  Filled 2021-03-11: qty 50

## 2021-03-11 MED ORDER — CLINDAMYCIN PHOSPHATE 600 MG/50ML IV SOLN: 600.0000 mg | INTRAVENOUS | Status: DC

## 2021-03-11 MED ORDER — CLINDAMYCIN PHOSPHATE 900 MG/50ML IV SOLN
INTRAVENOUS | Status: DC | PRN
  Administered 2021-03-11: 600 mg via INTRAVENOUS

## 2021-03-11 MED ORDER — BUPIVACAINE HCL (PF) 0.5 % IJ SOLN
INTRAMUSCULAR | Status: DC | PRN
  Administered 2021-03-11: 29 mL

## 2021-03-11 MED ORDER — CHLORHEXIDINE GLUCONATE CLOTH 2 % EX PADS
6.0000 | MEDICATED_PAD | Freq: Once | CUTANEOUS | Status: AC
  Administered 2021-03-11: 6 via TOPICAL

## 2021-03-11 MED ORDER — ONDANSETRON HCL 4 MG/2ML IJ SOLN
INTRAMUSCULAR | Status: DC | PRN
  Administered 2021-03-11: 4 mg via INTRAVENOUS

## 2021-03-11 MED ORDER — CEFAZOLIN SODIUM-DEXTROSE 2-4 GM/100ML-% IV SOLN
INTRAVENOUS | Status: AC
  Filled 2021-03-11: qty 100

## 2021-03-11 MED ORDER — EPHEDRINE 5 MG/ML INJ
INTRAVENOUS | Status: AC
  Filled 2021-03-11: qty 10

## 2021-03-11 MED ORDER — FENTANYL CITRATE (PF) 100 MCG/2ML IJ SOLN
INTRAMUSCULAR | Status: AC
  Administered 2021-03-11: 25 ug via INTRAVENOUS
  Filled 2021-03-11: qty 2

## 2021-03-11 MED ORDER — DEXAMETHASONE SODIUM PHOSPHATE 10 MG/ML IJ SOLN
INTRAMUSCULAR | Status: AC
  Filled 2021-03-11: qty 1

## 2021-03-11 MED ORDER — ORAL CARE MOUTH RINSE: 15.0000 mL | Freq: Once | OROMUCOSAL | Status: AC

## 2021-03-11 MED ORDER — FENTANYL CITRATE (PF) 100 MCG/2ML IJ SOLN
INTRAMUSCULAR | Status: DC | PRN
  Administered 2021-03-11 (×2): 50 ug via INTRAVENOUS

## 2021-03-11 MED ORDER — GABAPENTIN 300 MG PO CAPS
ORAL_CAPSULE | ORAL | Status: AC
  Administered 2021-03-11: 300 mg via ORAL
  Filled 2021-03-11: qty 1

## 2021-03-11 MED ORDER — GABAPENTIN 400 MG PO CAPS: 400.0000 mg | ORAL_CAPSULE | Freq: Three times a day (TID) | ORAL | 3 refills | Status: DC

## 2021-03-11 MED ORDER — MELOXICAM 7.5 MG PO TABS
ORAL_TABLET | ORAL | Status: AC
  Administered 2021-03-11: 15 mg via ORAL
  Filled 2021-03-11: qty 2

## 2021-03-11 MED ORDER — EPHEDRINE SULFATE 50 MG/ML IJ SOLN
INTRAMUSCULAR | Status: DC | PRN
  Administered 2021-03-11: 10 mg via INTRAVENOUS

## 2021-03-11 MED ORDER — LACTATED RINGERS IV SOLN: INTRAVENOUS | Status: DC

## 2021-03-11 MED ORDER — CEFAZOLIN SODIUM-DEXTROSE 2-4 GM/100ML-% IV SOLN
2.0000 g | INTRAVENOUS | Status: AC
  Administered 2021-03-11: 2 g via INTRAVENOUS

## 2021-03-11 MED ORDER — FENTANYL CITRATE (PF) 100 MCG/2ML IJ SOLN
25.0000 ug | INTRAMUSCULAR | Status: DC | PRN
  Administered 2021-03-11 (×2): 25 ug via INTRAVENOUS

## 2021-03-11 SURGICAL SUPPLY — 50 items
ANCHOR MINI QUICK PLUS  0SUT (Anchor) ×1 IMPLANT
APL PRP STRL LF DISP 70% ISPRP (MISCELLANEOUS) ×1
BLADE OSC/SAGITTAL MD 5.5X18 (BLADE) ×2 IMPLANT
BLADE SURG MINI STRL (BLADE) ×2 IMPLANT
BNDG ESMARK 4X12 TAN STRL LF (GAUZE/BANDAGES/DRESSINGS) ×2 IMPLANT
CHLORAPREP W/TINT 26 (MISCELLANEOUS) ×2 IMPLANT
COVER WAND RF STERILE (DRAPES) ×2 IMPLANT
CUFF TOURN SGL QUICK 18X4 (TOURNIQUET CUFF) ×2 IMPLANT
DECANTER SPIKE VIAL GLASS SM (MISCELLANEOUS) ×2 IMPLANT
DRAPE FLUOR MINI C-ARM 54X84 (DRAPES) ×1 IMPLANT
DRSG GAUZE FLUFF 36X18 (GAUZE/BANDAGES/DRESSINGS) ×6 IMPLANT
ELECT REM PT RETURN 9FT ADLT (ELECTROSURGICAL) ×2
ELECTRODE REM PT RTRN 9FT ADLT (ELECTROSURGICAL) ×1 IMPLANT
GAUZE XEROFORM 1X8 LF (GAUZE/BANDAGES/DRESSINGS) ×2 IMPLANT
GLOVE SURG ENC MOIS LTX SZ7.5 (GLOVE) ×2 IMPLANT
GLOVE SURG ENC MOIS LTX SZ8 (GLOVE) IMPLANT
GOWN STRL REUS W/ TWL LRG LVL3 (GOWN DISPOSABLE) ×1 IMPLANT
GOWN STRL REUS W/TWL LRG LVL3 (GOWN DISPOSABLE) ×2
GOWN STRL REUS W/TWL LRG LVL4 (GOWN DISPOSABLE) ×2 IMPLANT
KIT TURNOVER KIT A (KITS) ×2 IMPLANT
LOOP VESSEL MINI 0.8X406 BLUE (MISCELLANEOUS) ×1 IMPLANT
LOOPS BLUE MINI 0.8X406MM (MISCELLANEOUS) ×1
MANIFOLD NEPTUNE II (INSTRUMENTS) ×1 IMPLANT
NDL FILTER BLUNT 18X1 1/2 (NEEDLE) ×1 IMPLANT
NEEDLE FILTER BLUNT 18X 1/2SAF (NEEDLE) ×1
NEEDLE FILTER BLUNT 18X1 1/2 (NEEDLE) ×1 IMPLANT
NS IRRIG 500ML POUR BTL (IV SOLUTION) ×2 IMPLANT
PACK EXTREMITY ARMC (MISCELLANEOUS) ×2 IMPLANT
PAD CAST CTTN 4X4 STRL (SOFTGOODS) ×1 IMPLANT
PADDING CAST COTTON 4X4 STRL (SOFTGOODS) ×2
PASSER SUT SWANSON 36MM LOOP (INSTRUMENTS) ×2 IMPLANT
PENCIL SMOKE EVACUATOR (MISCELLANEOUS) ×1 IMPLANT
SPLINT CAST 1 STEP 3X12 (MISCELLANEOUS) ×2 IMPLANT
SPONGE LAP 18X18 RF (DISPOSABLE) ×2 IMPLANT
STOCKINETTE 48X4 2 PLY STRL (GAUZE/BANDAGES/DRESSINGS) ×1 IMPLANT
STOCKINETTE BIAS CUT 4 980044 (GAUZE/BANDAGES/DRESSINGS) ×1 IMPLANT
STOCKINETTE STRL 4IN 9604848 (GAUZE/BANDAGES/DRESSINGS) ×2 IMPLANT
SUT ETHILON 4-0 (SUTURE) ×2
SUT ETHILON 4-0 FS2 18XMFL BLK (SUTURE) ×1
SUT ETHILON 5-0 FS-2 18 BLK (SUTURE) ×2 IMPLANT
SUT VIC AB 2-0 SH 27 (SUTURE) ×2
SUT VIC AB 2-0 SH 27XBRD (SUTURE) ×1 IMPLANT
SUT VIC AB 3-0 SH 27 (SUTURE) ×2
SUT VIC AB 3-0 SH 27X BRD (SUTURE) ×1 IMPLANT
SUT VIC AB 4-0 SH 27 (SUTURE) ×2
SUT VIC AB 4-0 SH 27XANBCTRL (SUTURE) ×1 IMPLANT
SUTURE ETHLN 4-0 FS2 18XMF BLK (SUTURE) ×1 IMPLANT
SYR 30ML LL (SYRINGE) ×2 IMPLANT
SYR 5ML LL (SYRINGE) ×2 IMPLANT
WIRE Z .062 C-WIRE SPADE TIP (WIRE) ×2 IMPLANT

## 2021-03-11 NOTE — Discharge Instructions (Signed)

## 2021-03-11 NOTE — Anesthesia Preprocedure Evaluation (Signed)
Anesthesia Evaluation  Patient identified by MRN, date of birth, ID band Patient awake    Reviewed: Allergy & Precautions, H&P , NPO status , Patient's Chart, lab work & pertinent test results, reviewed documented beta blocker date and time   Airway Mallampati: II  TM Distance: >3 FB Neck ROM: full    Dental  (+) Teeth Intact   Pulmonary COPD, Current Smoker,    Pulmonary exam normal        Cardiovascular Exercise Tolerance: Good hypertension, On Medications (-) angina+ CAD  Normal cardiovascular exam Rate:Normal     Neuro/Psych  Headaches, negative psych ROS   GI/Hepatic Neg liver ROS, PUD, GERD  Medicated,  Endo/Other  negative endocrine ROS  Renal/GU negative Renal ROS  negative genitourinary   Musculoskeletal   Abdominal   Peds  Hematology negative hematology ROS (+)   Anesthesia Other Findings   Reproductive/Obstetrics negative OB ROS                             Anesthesia Physical Anesthesia Plan  ASA: III  Anesthesia Plan: General LMA   Post-op Pain Management:    Induction:   PONV Risk Score and Plan: 2  Airway Management Planned:   Additional Equipment:   Intra-op Plan:   Post-operative Plan:   Informed Consent: I have reviewed the patients History and Physical, chart, labs and discussed the procedure including the risks, benefits and alternatives for the proposed anesthesia with the patient or authorized representative who has indicated his/her understanding and acceptance.       Plan Discussed with: CRNA  Anesthesia Plan Comments:         Anesthesia Quick Evaluation

## 2021-03-11 NOTE — H&P (Signed)
THE PATIENT WAS SEEN PRIOR TO SURGERY TODAY.  HISTORY, ALLERGIES, HOME MEDICATIONS AND OPERATIVE PROCEDURE WERE REVIEWED. RISKS AND BENEFITS OF SURGERY DISCUSSED WITH PATIENT AGAIN.  NO CHANGES FROM INITIAL HISTORY AND PHYSICAL NOTED.    

## 2021-03-11 NOTE — Op Note (Signed)
03/11/2021  9:36 AM  PATIENT:  Brad Myers    PRE-OPERATIVE DIAGNOSIS:  R48.54 With osteoarth of first carpometacarp joints LEFT  POST-OPERATIVE DIAGNOSIS:  Same  PROCEDURE:  ARTHROPLASTY, INTERPOSITION/INTERCARPAL/CMC JOINTS (SURG) with palmaris tendon graft  SURGEON:  Park Breed, MD  ANESTHESIA:   General  PREOPERATIVE INDICATIONS:  Brad Myers is a  75 y.o. male with a diagnosis of M18.2 Bi post-trauma osteoarth of first carpometacarp joints LEFT who failed conservative measures and elected for surgical management.    The risks benefits and alternatives were discussed with the patient preoperatively including but not limited to the risks of infection, bleeding, nerve injury, cardiopulmonary complications, the need for revision surgery, among others, and the patient was willing to proceed.  EBL:  None  TOURNIQUET TIME: 73 MIN  OPERATIVE IMPLANTS: None  OPERATIVE FINDINGS: Advanced arthritis  thumb cmc joint  OPERATIVE PROCEDURE: The patient was brought to the operating room and underwent satisfactory general anesthesia in the supine position. The operative arm was prepped and draped in a sterile fashion. The patient was noted to have an excellent palmaris longus tendon. 3 short transverse incisions were made over the course of the tendon and it was harvested under direct vision. It was then placed in a moist sponge on the back table. An S-shaped incision was then made over the base of the thumb metacarpal dorsally. Dissection was carried out carefully under loupe magnification, carefully sparing the sensory nerves. The tendon sheath around the abductor pollicis longus and extensor pollicis brevis was released under direct vision, including over the radial styloid. Nerves were carefully spared. Retractors were inserted and the capsule of the trapezium and metacarpal were opened longitudinally. The radial artery and veins were carefully dissected free and retracted with a  vessel loop drain. The capsule was dissected off the trapezium and the trapezium was then morselized with the oscillating saw and rongeur. The flexor carpi radialis tendon was freed up in the base of the wound. The palmaris longus tendon was passed beneath this. A 3.5 mm drill was used to create a tunnel through the base of the metacarpal. The tendon was then passed up through the metacarpal using a tendon passer. The thumb had been placed in traction which was then released. The tendon was tied upon itself and the knot was sutured to itself prevent slippage. It was then tied in multiple knots and sutured into a ball. This was then rotated into the space between the metacarpal and the scaphoid. The capsule was then carefully and thoroughly, closed with 3-0 Vicryl suture. After irrigation, the skin was closed with 5-0 nylon. The forearm wounds had been closed with a similar suture. A well-padded thumb spica splint was applied. Tourniquet was deflated with good return of blood flow to the hand. Sponge and needle counts were correct. Patient was taken to recovery in good condition.  Park Breed, MD

## 2021-03-11 NOTE — Transfer of Care (Signed)
Immediate Anesthesia Transfer of Care Note  Patient: Brad Myers  Procedure(s) Performed: ARTHROPLASTY, INTERPOSITION/INTERCARPAL/CMC JOINTS (SURG) (Left Thumb)  Patient Location: PACU  Anesthesia Type:General  Level of Consciousness: sedated  Airway & Oxygen Therapy: Patient Spontanous Breathing and Patient connected to face mask oxygen  Post-op Assessment: Report given to RN and Post -op Vital signs reviewed and stable  Post vital signs: Reviewed and stable  Last Vitals:  Vitals Value Taken Time  BP 139/79 03/11/21 0943  Temp    Pulse 56 03/11/21 0944  Resp 13 03/11/21 0944  SpO2 99 % 03/11/21 0944  Vitals shown include unvalidated device data.  Last Pain:  Vitals:   03/11/21 0619  TempSrc: Temporal  PainSc: 5          Complications: No complications documented.

## 2021-03-14 NOTE — Anesthesia Postprocedure Evaluation (Signed)
Anesthesia Post Note  Patient: Brad Myers  Procedure(s) Performed: ARTHROPLASTY, INTERPOSITION/INTERCARPAL/CMC JOINTS (SURG) (Left Thumb)  Patient location during evaluation: PACU Anesthesia Type: General Level of consciousness: awake and alert Pain management: pain level controlled Vital Signs Assessment: post-procedure vital signs reviewed and stable Respiratory status: spontaneous breathing, nonlabored ventilation, respiratory function stable and patient connected to nasal cannula oxygen Cardiovascular status: blood pressure returned to baseline and stable Postop Assessment: no apparent nausea or vomiting Anesthetic complications: no   No complications documented.   Last Vitals:  Vitals:   03/11/21 1042 03/11/21 1055  BP: (!) 154/73 (!) 159/60  Pulse: 64 89  Resp: 16 16  Temp: (!) 36.1 C   SpO2: 99% 97%    Last Pain:  Vitals:   03/12/21 0841  TempSrc:   PainSc: [name]

## 2021-03-15 DIAGNOSIS — M182 Bilateral post-traumatic osteoarthritis of first carpometacarpal joints: Secondary | ICD-10-CM | POA: Diagnosis not present

## 2021-03-26 DIAGNOSIS — M182 Bilateral post-traumatic osteoarthritis of first carpometacarpal joints: Secondary | ICD-10-CM | POA: Diagnosis not present

## 2021-04-14 DIAGNOSIS — H2513 Age-related nuclear cataract, bilateral: Secondary | ICD-10-CM | POA: Diagnosis not present

## 2021-04-14 DIAGNOSIS — H532 Diplopia: Secondary | ICD-10-CM | POA: Diagnosis not present

## 2021-04-14 DIAGNOSIS — H02831 Dermatochalasis of right upper eyelid: Secondary | ICD-10-CM | POA: Diagnosis not present

## 2021-04-14 DIAGNOSIS — H2512 Age-related nuclear cataract, left eye: Secondary | ICD-10-CM | POA: Diagnosis not present

## 2021-04-14 DIAGNOSIS — H02834 Dermatochalasis of left upper eyelid: Secondary | ICD-10-CM | POA: Diagnosis not present

## 2021-04-29 DIAGNOSIS — M1812 Unilateral primary osteoarthritis of first carpometacarpal joint, left hand: Secondary | ICD-10-CM | POA: Diagnosis not present

## 2021-05-04 DIAGNOSIS — H25812 Combined forms of age-related cataract, left eye: Secondary | ICD-10-CM | POA: Diagnosis not present

## 2021-05-04 DIAGNOSIS — H2511 Age-related nuclear cataract, right eye: Secondary | ICD-10-CM | POA: Diagnosis not present

## 2021-05-04 DIAGNOSIS — H2512 Age-related nuclear cataract, left eye: Secondary | ICD-10-CM | POA: Diagnosis not present

## 2021-05-18 DIAGNOSIS — H25811 Combined forms of age-related cataract, right eye: Secondary | ICD-10-CM | POA: Diagnosis not present

## 2021-05-18 DIAGNOSIS — H2511 Age-related nuclear cataract, right eye: Secondary | ICD-10-CM | POA: Diagnosis not present

## 2021-06-07 ENCOUNTER — Emergency Department: Payer: Medicare Other

## 2021-06-07 ENCOUNTER — Encounter: Payer: Self-pay | Admitting: *Deleted

## 2021-06-07 ENCOUNTER — Other Ambulatory Visit: Payer: Self-pay

## 2021-06-07 DIAGNOSIS — R9431 Abnormal electrocardiogram [ECG] [EKG]: Secondary | ICD-10-CM | POA: Insufficient documentation

## 2021-06-07 DIAGNOSIS — I10 Essential (primary) hypertension: Secondary | ICD-10-CM | POA: Diagnosis not present

## 2021-06-07 DIAGNOSIS — F17210 Nicotine dependence, cigarettes, uncomplicated: Secondary | ICD-10-CM | POA: Insufficient documentation

## 2021-06-07 DIAGNOSIS — Z79899 Other long term (current) drug therapy: Secondary | ICD-10-CM | POA: Diagnosis not present

## 2021-06-07 DIAGNOSIS — Z20822 Contact with and (suspected) exposure to covid-19: Secondary | ICD-10-CM | POA: Diagnosis not present

## 2021-06-07 DIAGNOSIS — R519 Headache, unspecified: Secondary | ICD-10-CM | POA: Diagnosis not present

## 2021-06-07 DIAGNOSIS — R0789 Other chest pain: Secondary | ICD-10-CM | POA: Insufficient documentation

## 2021-06-07 LAB — BASIC METABOLIC PANEL
Anion gap: 10 (ref 5–15)
BUN: 12 mg/dL (ref 8–23)
CO2: 24 mmol/L (ref 22–32)
Calcium: 9.5 mg/dL (ref 8.9–10.3)
Chloride: 100 mmol/L (ref 98–111)
Creatinine, Ser: 1.12 mg/dL (ref 0.61–1.24)
GFR, Estimated: >60 mL/min (ref >60)
Glucose, Bld: 103 mg/dL — ABNORMAL HIGH (ref 70–99)
Potassium: 3.2 mmol/L — ABNORMAL LOW (ref 3.5–5.1)
Sodium: 134 mmol/L — ABNORMAL LOW (ref 135–145)

## 2021-06-07 LAB — CBC
HCT: 43.2 % (ref 39.0–52.0)
Hemoglobin: 15.7 g/dL (ref 13.0–17.0)
MCH: 34 pg (ref 26.0–34.0)
MCHC: 36.3 g/dL — ABNORMAL HIGH (ref 30.0–36.0)
MCV: 93.5 fL (ref 80.0–100.0)
Platelets: 179 10*3/uL (ref 150–400)
RBC: 4.62 MIL/uL (ref 4.22–5.81)
RDW: 13.2 % (ref 11.5–15.5)
WBC: 15.1 10*3/uL — ABNORMAL HIGH (ref 4.0–10.5)
nRBC: 0 % (ref 0.0–0.2)

## 2021-06-07 NOTE — ED Triage Notes (Signed)
Pt reports a headache and right side neck pain that began this afternoon.  No n/v/d.  No slurred speech.  No diff ambulating.  Pt alert  speech clear.

## 2021-06-08 ENCOUNTER — Telehealth: Payer: Self-pay | Admitting: Internal Medicine

## 2021-06-08 ENCOUNTER — Other Ambulatory Visit: Payer: Self-pay

## 2021-06-08 ENCOUNTER — Observation Stay (HOSPITAL_BASED_OUTPATIENT_CLINIC_OR_DEPARTMENT_OTHER)
Admit: 2021-06-08 | Discharge: 2021-06-08 | Disposition: A | Discharge status: Home / Self Care | Payer: Medicare Other | Attending: Physician Assistant | Admitting: Physician Assistant

## 2021-06-08 ENCOUNTER — Observation Stay
Admission: EM | Admit: 2021-06-08 | Discharge: 2021-06-08 | Disposition: A | Discharge status: Home / Self Care | Payer: Medicare Other | Attending: Internal Medicine | Admitting: Internal Medicine

## 2021-06-08 DIAGNOSIS — R0789 Other chest pain: Secondary | ICD-10-CM

## 2021-06-08 DIAGNOSIS — I10 Essential (primary) hypertension: Secondary | ICD-10-CM | POA: Diagnosis not present

## 2021-06-08 DIAGNOSIS — Z720 Tobacco use: Secondary | ICD-10-CM

## 2021-06-08 DIAGNOSIS — R079 Chest pain, unspecified: Secondary | ICD-10-CM

## 2021-06-08 DIAGNOSIS — I309 Acute pericarditis, unspecified: Secondary | ICD-10-CM

## 2021-06-08 DIAGNOSIS — E785 Hyperlipidemia, unspecified: Secondary | ICD-10-CM

## 2021-06-08 DIAGNOSIS — I160 Hypertensive urgency: Secondary | ICD-10-CM

## 2021-06-08 DIAGNOSIS — R9431 Abnormal electrocardiogram [ECG] [EKG]: Secondary | ICD-10-CM | POA: Diagnosis not present

## 2021-06-08 DIAGNOSIS — R519 Headache, unspecified: Secondary | ICD-10-CM | POA: Diagnosis not present

## 2021-06-08 LAB — RESP PANEL BY RT-PCR (FLU A&B, COVID) ARPGX2
Influenza A by PCR: NEGATIVE
Influenza B by PCR: NEGATIVE
SARS Coronavirus 2 by RT PCR: NEGATIVE

## 2021-06-08 LAB — CBC
HCT: 40.8 % (ref 39.0–52.0)
Hemoglobin: 14.5 g/dL (ref 13.0–17.0)
MCH: 34 pg (ref 26.0–34.0)
MCHC: 35.5 g/dL (ref 30.0–36.0)
MCV: 95.6 fL (ref 80.0–100.0)
Platelets: 165 10*3/uL (ref 150–400)
RBC: 4.27 MIL/uL (ref 4.22–5.81)
RDW: 13.2 % (ref 11.5–15.5)
WBC: 12.1 10*3/uL — ABNORMAL HIGH (ref 4.0–10.5)
nRBC: 0 % (ref 0.0–0.2)

## 2021-06-08 LAB — BASIC METABOLIC PANEL
Anion gap: 8 (ref 5–15)
BUN: 14 mg/dL (ref 8–23)
CO2: 26 mmol/L (ref 22–32)
Calcium: 9 mg/dL (ref 8.9–10.3)
Chloride: 101 mmol/L (ref 98–111)
Creatinine, Ser: 0.94 mg/dL (ref 0.61–1.24)
GFR, Estimated: >60 mL/min (ref >60)
Glucose, Bld: 92 mg/dL (ref 70–99)
Potassium: 3.6 mmol/L (ref 3.5–5.1)
Sodium: 135 mmol/L (ref 135–145)

## 2021-06-08 LAB — ECHOCARDIOGRAM COMPLETE: S' Lateral: 2.43 cm

## 2021-06-08 LAB — TROPONIN I (HIGH SENSITIVITY)
Troponin I (High Sensitivity): 7 ng/L (ref <18)
Troponin I (High Sensitivity): 7 ng/L (ref <18)

## 2021-06-08 LAB — GROUP A STREP BY PCR: Group A Strep by PCR: NOT DETECTED

## 2021-06-08 MED ORDER — ONDANSETRON HCL 4 MG PO TABS: 4.0000 mg | ORAL_TABLET | Freq: Four times a day (QID) | ORAL | Status: DC | PRN

## 2021-06-08 MED ORDER — MORPHINE SULFATE (PF) 2 MG/ML IV SOLN
2.0000 mg | INTRAVENOUS | Status: DC | PRN
Start: 2021-06-08 — End: 2021-06-08

## 2021-06-08 MED ORDER — TRAZODONE HCL 50 MG PO TABS: 50.0000 mg | ORAL_TABLET | Freq: Every day | ORAL | Status: DC

## 2021-06-08 MED ORDER — KETOROLAC TROMETHAMINE 30 MG/ML IJ SOLN
15.0000 mg | Freq: Four times a day (QID) | INTRAMUSCULAR | Status: DC | PRN
Start: 2021-06-08 — End: 2021-06-08

## 2021-06-08 MED ORDER — ATORVASTATIN CALCIUM 20 MG PO TABS
40.0000 mg | ORAL_TABLET | Freq: Every day | ORAL | Status: DC
  Administered 2021-06-08: 40 mg via ORAL
  Filled 2021-06-08: qty 2

## 2021-06-08 MED ORDER — TRIAMTERENE-HCTZ 37.5-25 MG PO TABS
2.0000 | ORAL_TABLET | Freq: Every day | ORAL | Status: DC
  Filled 2021-06-08: qty 2

## 2021-06-08 MED ORDER — KETOROLAC TROMETHAMINE 0.5 % OP SOLN
1.0000 [drp] | Freq: Four times a day (QID) | OPHTHALMIC | Status: DC
  Administered 2021-06-08: 1 [drp] via OPHTHALMIC
  Filled 2021-06-08: qty 3

## 2021-06-08 MED ORDER — ASPIRIN 325 MG PO TBEC: 325.0000 mg | DELAYED_RELEASE_TABLET | Freq: Every day | ORAL | 0 refills | Status: DC

## 2021-06-08 MED ORDER — TRAZODONE HCL 50 MG PO TABS: 25.0000 mg | ORAL_TABLET | Freq: Every evening | ORAL | Status: DC | PRN

## 2021-06-08 MED ORDER — NITROGLYCERIN 0.4 MG SL SUBL: 0.4000 mg | SUBLINGUAL_TABLET | SUBLINGUAL | Status: DC | PRN

## 2021-06-08 MED ORDER — ACETAMINOPHEN 650 MG RE SUPP: 650.0000 mg | Freq: Four times a day (QID) | RECTAL | Status: DC | PRN

## 2021-06-08 MED ORDER — KETOROLAC TROMETHAMINE 30 MG/ML IJ SOLN
30.0000 mg | Freq: Once | INTRAMUSCULAR | Status: AC
  Administered 2021-06-08: 30 mg via INTRAVENOUS
  Filled 2021-06-08: qty 1

## 2021-06-08 MED ORDER — POTASSIUM 99 MG PO TABS: 99.0000 mg | ORAL_TABLET | ORAL | Status: DC

## 2021-06-08 MED ORDER — ACETAMINOPHEN 325 MG PO TABS: 650.0000 mg | ORAL_TABLET | Freq: Four times a day (QID) | ORAL | Status: DC | PRN

## 2021-06-08 MED ORDER — COLCHICINE 0.6 MG PO TABS
0.6000 mg | ORAL_TABLET | Freq: Two times a day (BID) | ORAL | Status: DC
  Administered 2021-06-08: 0.6 mg via ORAL
  Filled 2021-06-08 (×2): qty 1

## 2021-06-08 MED ORDER — MAGNESIUM HYDROXIDE 400 MG/5ML PO SUSP: 30.0000 mL | Freq: Every day | ORAL | Status: DC | PRN

## 2021-06-08 MED ORDER — ASPIRIN EC 325 MG PO TBEC
325.0000 mg | DELAYED_RELEASE_TABLET | Freq: Every day | ORAL | Status: DC
  Administered 2021-06-08: 325 mg via ORAL
  Filled 2021-06-08: qty 1

## 2021-06-08 MED ORDER — SODIUM CHLORIDE 0.9 % IV SOLN: INTRAVENOUS | Status: DC

## 2021-06-08 MED ORDER — ONDANSETRON HCL 4 MG/2ML IJ SOLN: 4.0000 mg | Freq: Four times a day (QID) | INTRAMUSCULAR | Status: DC | PRN

## 2021-06-08 MED ORDER — TRIAMTERENE-HCTZ 75-50 MG PO TABS
1.0000 | ORAL_TABLET | Freq: Every day | ORAL | Status: DC
  Filled 2021-06-08 (×2): qty 1

## 2021-06-08 MED ORDER — COLCHICINE 0.6 MG PO TABS: 0.6000 mg | ORAL_TABLET | Freq: Two times a day (BID) | ORAL | 0 refills | Status: DC

## 2021-06-08 MED ORDER — ENOXAPARIN SODIUM 40 MG/0.4ML IJ SOSY
40.0000 mg | PREFILLED_SYRINGE | INTRAMUSCULAR | Status: DC
  Administered 2021-06-08: 40 mg via SUBCUTANEOUS
  Filled 2021-06-08: qty 0.4

## 2021-06-08 NOTE — Progress Notes (Signed)
*  PRELIMINARY RESULTS* Echocardiogram 2D Echocardiogram has been performed.  Sherrie Sport 06/08/2021, 9:30 AM

## 2021-06-08 NOTE — Discharge Summary (Signed)
Physician Discharge Summary  Brad Myers [id number] DOB: 12/21/1945 DOA: 06/08/2021  PCP: [name], MD  Admit date: 06/08/2021 Discharge date: 06/08/2021  Admitted From: Home Disposition: Home  Recommendations for Outpatient Follow-up:  Follow up with PCP in 1-2 weeks Follow-up with cardiology in 1 to 2 weeks Please obtain BMP/CBC in one week Please follow up on the following pending results: None  Home Health: No Equipment/Devices: None Discharge Condition: Stable CODE STATUS: Full Diet recommendation: Heart Healthy   Brief/Interim Summary: Brad Myers is a 76 y.o. Caucasian male with medical history significant for hypertension, dyslipidemia, osteoarthritis and migraine, who presented to the emergency room with acute onset of left-sided periorbital headache.  He was also complaining of left-sided chest pain, feels more like pressure.  No associated nausea, vomiting, shortness of breath or diaphoresis.  EKG demonstrates normal sinus rhythm with inferior and anterolateral J-point elevation as well as subtle PR depression in the inferolateral leads.  PR elevation in aVR is also noted.  Troponin remained negative.  Echocardiogram without any significant abnormality.  Cardiology was also consulted and he responded very well to a dose of Toradol. Patient most likely had pericarditis based on EKG findings and his response to Toradol.  He was sent home on colchicine and can also take ibuprofen 800 mg up to 3 times a day if needed.  Patient will continue with rest of his home medications and follow-up with his providers.  Discharge Diagnoses:  Active Problems:   Chest pain   Discharge Instructions  Discharge Instructions     Diet - low sodium heart healthy   Complete by: As directed    Discharge instructions   Complete by: As directed    It was pleasure taking care of you. Your cardiologist think that you have some inflammation around the lining of your heart. This  started you on a medicine called colchicine, please take this twice daily and follow-up with them in the clinic in 1 to 2 weeks for further recommendations. If you need more help with pain you can take up to 800 mg of ibuprofen, which can be repeated every 8 hourly.   Increase activity slowly   Complete by: As directed       Allergies as of 06/08/2021   No Known Allergies      Medication List     STOP taking these medications    HYDROcodone-acetaminophen 5-325 MG tablet Commonly known as: Norco   meloxicam 15 MG tablet Commonly known as: MOBIC       TAKE these medications    aspirin 325 MG EC tablet Take 1 tablet (325 mg total) by mouth daily. Start taking on: June 09, 2021   atorvastatin 40 MG tablet Commonly known as: LIPITOR TAKE 1 TABLET(40 MG) BY MOUTH DAILY What changed: See the new instructions.   colchicine 0.6 MG tablet Take 1 tablet (0.6 mg total) by mouth 2 (two) times daily.   gabapentin 400 MG capsule Commonly known as: Neurontin Take 1 capsule (400 mg total) by mouth 3 (three) times daily.   Potassium 99 MG Tabs Take 99 mg by mouth every other day.   Prolensa 0.07 % Soln Generic drug: Bromfenac Sodium Apply 1 drop to eye 2 (two) times daily.   traZODone 50 MG tablet Commonly known as: DESYREL TAKE 1 TABLET BY MOUTH EVERY NIGHT AT BEDTIME   triamterene-hydrochlorothiazide 75-50 MG tablet Commonly known as: MAXZIDE Take 1 tablet by mouth daily.   Vitamin D3 25 MCG (1000  UT) Caps Take 1,000 Units by mouth every other day.        Follow-up Information     [name], MD. Schedule an appointment as soon as possible for a visit in 1 week(s).   Specialty: Family Medicine Contact information: [address] [place] [address] [phone]         Nelva Bush, MD. Schedule an appointment as soon as possible for a visit in 1 week(s).   Specialty: Cardiology Contact information: [place] [address] [phone]                No Known Allergies  Consultations: Cardiology  Procedures/Studies: CT HEAD WO CONTRAST (5MM)  Result Date: 06/07/2021 CLINICAL DATA:  Headache neck pain.  Transient ischemic attack. EXAM: CT HEAD WITHOUT CONTRAST TECHNIQUE: Contiguous axial images were obtained from the base of the skull through the vertex without intravenous contrast. COMPARISON:  None. FINDINGS: Brain: The ventricles and sulci appropriate size for patient's age. The gray-white matter discrimination is preserved. There is no acute intracranial hemorrhage. No mass effect or midline shift no extra-axial fluid collection. A subcentimeter high attenuating focus along the right frontal lobe (23/2) likely a small calcified meningioma. Vascular: No hyperdense vessel or unexpected calcification. Skull: Normal. Negative for fracture or focal lesion. Sinuses/Orbits: No acute finding. Other: None IMPRESSION: Unremarkable noncontrast CT of the brain. Electronically Signed   By: Anner Crete M.D.   On: 06/07/2021 21:19   ECHOCARDIOGRAM COMPLETE  Result Date: 06/08/2021    ECHOCARDIOGRAM REPORT   Patient Name:   Brad Myers Date of Exam: 06/08/2021 Medical Rec #:  [id number]    Height:       65.0 in Accession #:    [id number]   Weight:       120.0 lb Date of Birth:  September 27, 1946   BSA:          1.592 m Patient Age:    46 years     BP:           121/51 mmHg Patient Gender: M            HR:           63 bpm. Exam Location:  [facility] Procedure: 2D Echo, Color Doppler and Cardiac Doppler Indications:     Chest pain R07.9  History:         Patient has no prior history of Echocardiogram examinations.                  Risk Factors:Hypertension and Dyslipidemia.  Sonographer:     Sherrie Sport Referring Phys:  [id number] [place] Diagnosing Phys: Nelva Bush MD  Sonographer Comments: Technically challenging study due to limited acoustic windows, no parasternal window and no apical window.  IMPRESSIONS  1. Left ventricular ejection fraction, by estimation, is 55 to 60%. The left ventricle has normal function. Left ventricular endocardial border not optimally defined to evaluate regional wall motion. Left ventricular diastolic function could not be evaluated.  2. Right ventricular systolic function is normal. The right ventricular size is normal.  3. The mitral valve is grossly normal. No evidence of mitral valve regurgitation.  4. The aortic valve was not well visualized. Aortic valve regurgitation is not visualized.  5. The inferior vena cava is normal in size with greater than 50% respiratory variability, suggesting right atrial pressure of 3 mmHg. FINDINGS  Left Ventricle: Left ventricular ejection fraction, by estimation, is  55 to 60%. The left ventricle has normal function. Left ventricular endocardial border not optimally defined to evaluate regional wall motion. The left ventricular internal cavity size was normal in size. There is no left ventricular hypertrophy. Left ventricular diastolic function could not be evaluated. Right Ventricle: The right ventricular size is normal. No increase in right ventricular wall thickness. Right ventricular systolic function is normal. Left Atrium: Left atrial size was normal in size. Right Atrium: Right atrial size was normal in size. Pericardium: There is no evidence of pericardial effusion. Mitral Valve: The mitral valve is grossly normal. No evidence of mitral valve regurgitation. Tricuspid Valve: The tricuspid valve is normal in structure. Tricuspid valve regurgitation is trivial. Aortic Valve: The aortic valve was not well visualized. Aortic valve regurgitation is not visualized. Pulmonic Valve: The pulmonic valve was not well visualized. Pulmonic valve regurgitation is not visualized. No evidence of pulmonic stenosis. Aorta: The ascending aorta was not well visualized. Pulmonary Artery: The pulmonary artery is of normal size. Venous: The inferior vena  cava is normal in size with greater than 50% respiratory variability, suggesting right atrial pressure of 3 mmHg. IAS/Shunts: No atrial level shunt detected by color flow Doppler.  LEFT VENTRICLE PLAX 2D LVIDd:         3.56 cm LVIDs:         2.43 cm LV PW:         0.75 cm LV IVS:        0.96 cm  LEFT ATRIUM           Index       RIGHT ATRIUM          Index LA diam:      3.10 cm 1.95 cm/m  RA Area:     9.58 cm LA Vol (A4C): 33.8 ml 21.23 ml/m RA Volume:   19.60 ml 12.31 ml/m  PULMONIC VALVE PV Vmax:        1.01 m/s PV Peak grad:   4.1 mmHg RVOT Peak grad: 4 mmHg  Nelva Bush MD Electronically signed by Nelva Bush MD Signature Date/Time: 06/08/2021/11:40:24 AM    Final     Subjective: Patient was seen and examined today.  Chest pain has been resolved after getting the Toradol.  Denies any shortness of breath.  Discharge Exam: Vitals:   06/08/21 1100 06/08/21 1130  BP: (!) 122/52 (!) 114/56  Pulse: 66 65  Resp: 18 20  Temp:    SpO2: 96% 97%   Vitals:   06/08/21 1000 06/08/21 1030 06/08/21 1100 06/08/21 1130  BP: (!) 146/75 (!) 123/52 (!) 122/52 (!) 114/56  Pulse: 65 65 66 65  Resp: '13 17 18 20  '$ Temp:      TempSrc:      SpO2: 98% 98% 96% 97%  Weight:      Height:        General: Pt is alert, awake, not in acute distress Cardiovascular: RRR, S1/S2 +, no rubs, no gallops Respiratory: CTA bilaterally, no wheezing, no rhonchi Abdominal: Soft, NT, ND, bowel sounds + Extremities: no edema, no cyanosis   The results of significant diagnostics from this hospitalization (including imaging, microbiology, ancillary and laboratory) are listed below for reference.    Microbiology: Recent Results (from the past 240 hour(s))  Resp Panel by RT-PCR (Flu A&B, Covid) Nasopharyngeal Swab     Status: None   Collection Time: 06/08/21  1:15 AM   Specimen: Nasopharyngeal Swab; Nasopharyngeal(NP) swabs in vial transport medium  Result Value Ref  Range Status   SARS Coronavirus 2 by RT PCR  NEGATIVE NEGATIVE Final    Comment: (NOTE) SARS-CoV-2 target nucleic acids are NOT DETECTED.  The SARS-CoV-2 RNA is generally detectable in upper respiratory specimens during the acute phase of infection. The lowest concentration of SARS-CoV-2 viral copies this assay can detect is 138 copies/mL. A negative result does not preclude SARS-Cov-2 infection and should not be used as the sole basis for treatment or other patient management decisions. A negative result may occur with  improper specimen collection/handling, submission of specimen other than nasopharyngeal swab, presence of viral mutation(s) within the areas targeted by this assay, and inadequate number of viral copies(<138 copies/mL). A negative result must be combined with clinical observations, patient history, and epidemiological information. The expected result is Negative.  Fact Sheet for Patients:  EntrepreneurPulse.com.au  Fact Sheet for Healthcare Providers:  IncredibleEmployment.be  This test is no t yet approved or cleared by the [place] FDA and  has been authorized for detection and/or diagnosis of SARS-CoV-2 by FDA under an Emergency Use Authorization (EUA). This EUA will remain  in effect (meaning this test can be used) for the duration of the COVID-19 declaration under Section 564(b)(1) of the Act, 21 U.S.C.section 360bbb-3(b)(1), unless the authorization is terminated  or revoked sooner.       Influenza A by PCR NEGATIVE NEGATIVE Final   Influenza B by PCR NEGATIVE NEGATIVE Final    Comment: (NOTE) The Xpert Xpress SARS-CoV-2/FLU/RSV plus assay is intended as an aid in the diagnosis of influenza from Nasopharyngeal swab specimens and should not be used as a sole basis for treatment. Nasal washings and aspirates are unacceptable for Xpert Xpress SARS-CoV-2/FLU/RSV testing.  Fact Sheet for Patients: EntrepreneurPulse.com.au  Fact Sheet for  Healthcare Providers: IncredibleEmployment.be  This test is not yet approved or cleared by the [place] FDA and has been authorized for detection and/or diagnosis of SARS-CoV-2 by FDA under an Emergency Use Authorization (EUA). This EUA will remain in effect (meaning this test can be used) for the duration of the COVID-19 declaration under Section 564(b)(1) of the Act, 21 U.S.C. section 360bbb-3(b)(1), unless the authorization is terminated or revoked.  Performed at [facility], [place], [place] [address]   Group A Strep by PCR [facility] Only)     Status: None   Collection Time: 06/08/21  1:15 AM   Specimen: Nasopharyngeal Swab; Sterile Swab  Result Value Ref Range Status   Group A Strep by PCR NOT DETECTED NOT DETECTED Final    Comment: Performed at [facility], [address] [place], [place] [address]     Labs: BNP (last 3 results) No results for input(s): BNP in the last 8760 hours. Basic Metabolic Panel: Recent Labs  Lab 06/07/21 2022 06/08/21 0348  NA 134* 135  K 3.2* 3.6  CL 100 101  CO2 24 26  GLUCOSE 103* 92  BUN 12 14  CREATININE 1.12 0.94  CALCIUM 9.5 9.0   Liver Function Tests: No results for input(s): AST, ALT, ALKPHOS, BILITOT, PROT, ALBUMIN in the last 168 hours. No results for input(s): LIPASE, AMYLASE in the last 168 hours. No results for input(s): AMMONIA in the last 168 hours. CBC: Recent Labs  Lab 06/07/21 2022 06/08/21 0348  WBC 15.1* 12.1*  HGB 15.7 14.5  HCT 43.2 40.8  MCV 93.5 95.6  PLT 179 165   Cardiac Enzymes: No results for input(s): CKTOTAL, CKMB, CKMBINDEX, TROPONINI in the last 168 hours. BNP:  Invalid input(s): POCBNP CBG: No results for input(s): GLUCAP in the last 168 hours. D-Dimer No results for input(s): DDIMER in the last 72 hours. Hgb A1c No results for input(s): HGBA1C in the last 72 hours. Lipid Profile No results for input(s): CHOL, HDL, LDLCALC, TRIG,  CHOLHDL, LDLDIRECT in the last 72 hours. Thyroid function studies No results for input(s): TSH, T4TOTAL, T3FREE, THYROIDAB in the last 72 hours.  Invalid input(s): FREET3 Anemia work up No results for input(s): VITAMINB12, FOLATE, FERRITIN, TIBC, IRON, RETICCTPCT in the last 72 hours. Urinalysis No results found for: COLORURINE, APPEARANCEUR, [place], [place], GLUCOSEU, [place], [place], [place], PROTEINUR, UROBILINOGEN, NITRITE, LEUKOCYTESUR Sepsis Labs Invalid input(s): PROCALCITONIN,  WBC,  LACTICIDVEN Microbiology Recent Results (from the past 240 hour(s))  Resp Panel by RT-PCR (Flu A&B, Covid) Nasopharyngeal Swab     Status: None   Collection Time: 06/08/21  1:15 AM   Specimen: Nasopharyngeal Swab; Nasopharyngeal(NP) swabs in vial transport medium  Result Value Ref Range Status   SARS Coronavirus 2 by RT PCR NEGATIVE NEGATIVE Final    Comment: (NOTE) SARS-CoV-2 target nucleic acids are NOT DETECTED.  The SARS-CoV-2 RNA is generally detectable in upper respiratory specimens during the acute phase of infection. The lowest concentration of SARS-CoV-2 viral copies this assay can detect is 138 copies/mL. A negative result does not preclude SARS-Cov-2 infection and should not be used as the sole basis for treatment or other patient management decisions. A negative result may occur with  improper specimen collection/handling, submission of specimen other than nasopharyngeal swab, presence of viral mutation(s) within the areas targeted by this assay, and inadequate number of viral copies(<138 copies/mL). A negative result must be combined with clinical observations, patient history, and epidemiological information. The expected result is Negative.  Fact Sheet for Patients:  EntrepreneurPulse.com.au  Fact Sheet for Healthcare Providers:  IncredibleEmployment.be  This test is no t yet approved or cleared by the [place] FDA and  has been  authorized for detection and/or diagnosis of SARS-CoV-2 by FDA under an Emergency Use Authorization (EUA). This EUA will remain  in effect (meaning this test can be used) for the duration of the COVID-19 declaration under Section 564(b)(1) of the Act, 21 U.S.C.section 360bbb-3(b)(1), unless the authorization is terminated  or revoked sooner.       Influenza A by PCR NEGATIVE NEGATIVE Final   Influenza B by PCR NEGATIVE NEGATIVE Final    Comment: (NOTE) The Xpert Xpress SARS-CoV-2/FLU/RSV plus assay is intended as an aid in the diagnosis of influenza from Nasopharyngeal swab specimens and should not be used as a sole basis for treatment. Nasal washings and aspirates are unacceptable for Xpert Xpress SARS-CoV-2/FLU/RSV testing.  Fact Sheet for Patients: EntrepreneurPulse.com.au  Fact Sheet for Healthcare Providers: IncredibleEmployment.be  This test is not yet approved or cleared by the [place] FDA and has been authorized for detection and/or diagnosis of SARS-CoV-2 by FDA under an Emergency Use Authorization (EUA). This EUA will remain in effect (meaning this test can be used) for the duration of the COVID-19 declaration under Section 564(b)(1) of the Act, 21 U.S.C. section 360bbb-3(b)(1), unless the authorization is terminated or revoked.  Performed at [facility], [place], [place] [address]   Group A Strep by PCR [facility] Only)     Status: None   Collection Time: 06/08/21  1:15 AM   Specimen: Nasopharyngeal Swab; Sterile Swab  Result Value Ref Range Status   Group A Strep by PCR NOT DETECTED NOT DETECTED Final  Comment: Performed at [facility], [address] [place], [place] [address]    Time coordinating discharge: Over 30 minutes  SIGNED:  Lorella Nimrod, MD  [facility] 06/08/2021, 1:25 PM  If 7PM-7AM, please contact night-coverage [email or web address]  This record has been created  using Systems analyst. Errors have been sought and corrected,but may not always be located. Such creation errors do not reflect on the standard of care.

## 2021-06-08 NOTE — Telephone Encounter (Signed)
-----   Message from Nelva Bush, MD sent at 06/08/2021  1:05 PM EDT ----- Regarding: Post-ED follow-up Hello,  Could you help arrange for a follow-up visit with me or an APP in 2-3 weeks for Brad Myers?  Thanks.  Chris End

## 2021-06-08 NOTE — ED Notes (Signed)
Verbalized understanding discharge instructions, prescriptions, and follow-up. In no acute distress.   

## 2021-06-08 NOTE — Telephone Encounter (Signed)
LVM to schedule

## 2021-06-08 NOTE — ED Notes (Signed)
Providers at bedside.

## 2021-06-08 NOTE — Consult Note (Signed)
Cardiology Consultation:   Patient ID: Brad Myers; [id number]; July 10, 1946   Admit date: 06/08/2021 Date of Consult: 06/08/2021  Primary Care Provider: [name], MD Primary Cardiologist: Brad Myers - consult by End Primary Electrophysiologist:  None   Patient Profile:   Brad Myers is a 75 y.o. male with a hx of HTN, HLD, osteoarthritis, headache disorder, ongoing tobacco use, and bilateral cataracts status postsurgery who is being seen today for the evaluation of chest pain at the request of Dr. Sidney Myers.  History of Present Illness:   Brad Myers has no previously known cardiac history.  He indicates that he underwent left cataract surgery in late 04/2021 followed by right cataract surgery in early 05/2021.  Since undergoing his first cataract surgery he has had severe left frontal lobe headaches.  Overnight, he again developed a left frontal lobe headache that was more severe prompting him to come to the ED.  Vital signs were stable.  CT head showed no acute intracranial pathology.  While in the ED he noted his left frontal headache radiated above the right eye, down the right side of the neck, and into his chest.  In this setting, EKG was obtained which showed sinus rhythm, diffuse J-point elevation, and PR depression.  High-sensitivity troponin was negative x2.  He was given ASA 325 mg x 1.  This was followed by resolution of chest pain.  He has been chest pain-free since.  Leading up to these events he denies any exertional symptoms.  He does continue to smoke 0.5 pack/day and has done so for 55 years.  He is currently chest pain-free.  Given EKG findings, cardiology started the patient on colchicine empirically.  Subsequent echo demonstrated an EF of 55 to 60%, normal RV systolic function and ventricular cavity size, no significant valvular abnormalities, no pericardial effusion, and an estimated right atrial pressure of 3 mmHg.    Past Medical History:  Diagnosis Date    Arthritis    Headache, migraine 06/24/2015   History of chicken pox 06/24/2015   DID have Chicken Pox. DID have Measles. DID have Mumps.     Hyperlipidemia    Hypertension    Stomach ulcer 06/24/2015    Past Surgical History:  Procedure Laterality Date   CARPOMETACARPAL (Brad Myers) FUSION OF THUMB Left 03/11/2021   Procedure: ARTHROPLASTY, INTERPOSITION/INTERCARPAL/CMC JOINTS (SURG);  Surgeon: Brad Leys, MD;  Location: [facility] ORS;  Service: Orthopedics;  Laterality: Left;   COLONOSCOPY WITH PROPOFOL N/A 07/11/2018   Procedure: COLONOSCOPY WITH PROPOFOL;  Surgeon: Brad Bellows, MD;  Location: [facility] ENDOSCOPY;  Service: Gastroenterology;  Laterality: N/A;   EYE SURGERY Right 2003   HERNIA REPAIR Right 2005     Home Meds: Prior to Admission medications   Medication Sig Start Date End Date Taking? Authorizing Provider  atorvastatin (LIPITOR) 40 MG tablet TAKE 1 TABLET(40 MG) BY MOUTH DAILY Patient taking differently: Take 40 mg by mouth daily. 05/31/19  Yes Brad Sons, MD  Potassium 99 MG TABS Take 99 mg by mouth every other day.   Yes [provider]  PROLENSA 0.07 % SOLN Apply 1 drop to eye 2 (two) times daily. 04/23/21  Yes [provider]  traZODone (DESYREL) 50 MG tablet TAKE 1 TABLET BY MOUTH EVERY NIGHT AT BEDTIME Patient taking differently: Take 50 mg by mouth at bedtime. 02/25/20  Yes Brad Sons, MD  triamterene-hydrochlorothiazide (MAXZIDE) 75-50 MG tablet Take 1 tablet by mouth daily.   Yes [provider]  Cholecalciferol (VITAMIN D3) 1000 units CAPS Take 1,000 Units by mouth every other day. Patient not taking: Reported on 06/08/2021    [provider]  gabapentin (NEURONTIN) 400 MG capsule Take 1 capsule (400 mg total) by mouth 3 (three) times daily. Patient not taking: No sig reported 03/11/21   Brad Leys, MD  HYDROcodone-acetaminophen [facility]) 5-325 MG tablet Take 1-2 tablets by mouth every 6 (six) hours as needed. Patient not taking:  No sig reported 03/11/21   Brad Leys, MD  meloxicam (MOBIC) 15 MG tablet Take 1 tablet (15 mg total) by mouth daily. Patient not taking: No sig reported 03/11/21   Brad Leys, MD    Inpatient Medications: Scheduled Meds:  aspirin EC  325 mg Oral Daily   atorvastatin  40 mg Oral Daily   colchicine  0.6 mg Oral BID   enoxaparin (LOVENOX) injection  40 mg Subcutaneous Q24H   ketorolac  1 drop Both Eyes QID   traZODone  50 mg Oral QHS   triamterene-hydrochlorothiazide  2 tablet Oral Daily   Continuous Infusions:  sodium chloride Stopped (06/08/21 0502)   PRN Meds: acetaminophen **OR** acetaminophen, ketorolac, magnesium hydroxide, morphine injection, nitroGLYCERIN, ondansetron **OR** ondansetron (ZOFRAN) IV, traZODone  Allergies:  No Known Allergies  Social History:   Social History   Socioeconomic History   Marital status: Divorced    Spouse name: Not on file   Number of children: 1   Years of education: Not on file   Highest education level: Some college, no degree  Occupational History   Occupation: retired  Tobacco Use   Smoking status: Every Day    Packs/day: 0.50    Years: 55.00    Pack years: 27.50    Types: Cigarettes   Smokeless tobacco: Former    Types: Snuff, Chew  Vaping Use   Vaping Use: Never used  Substance and Sexual Activity   Alcohol use: Yes    Alcohol/week: 12.0 - 20.0 standard drinks    Types: 12 - 20 Cans of beer per week    Comment: drinks 3-4 days a week   Drug use: No   Sexual activity: Not on file  Other Topics Concern   Not on file  Social History Narrative   Not on file   Social Determinants of Health   Financial Resource Strain: Low Risk    Difficulty of Paying Living Expenses: Not hard at all  Food Insecurity: No Food Insecurity   Worried About Charity fundraiser in the Last Year: Never true   Ran Out of Food in the Last Year: Never true  Transportation Needs: No Transportation Needs   Lack of Transportation (Medical): No    Lack of Transportation (Non-Medical): No  Physical Activity: Insufficiently Active   Days of Exercise per Week: 3 days   Minutes of Exercise per Session: 30 min  Stress: No Stress Concern Present   Feeling of Stress : Not at all  Social Connections: Socially Isolated   Frequency of Communication with Friends and Family: More than three times a week   Frequency of Social Gatherings with Friends and Family: More than three times a week   Attends Religious Services: Never   Marine scientist or Organizations: No   Attends Archivist Meetings: Never   Marital Status: Divorced  Human resources officer Violence: Not At Risk   Fear of Current or Ex-Partner: No   Emotionally Abused: No   Physically Abused: No   Sexually Abused: No  Family History:   Family History  Problem Relation Age of Onset   Lung cancer Mother        dx 44's, died 52. unk if 2 primary or met   Brain cancer Mother        dx 80's   Diabetes Father    Coronary artery disease Father    Colon cancer Neg Hx    Esophageal cancer Neg Hx    Inflammatory bowel disease Neg Hx    Liver disease Neg Hx    Pancreatic cancer Neg Hx    Rectal cancer Neg Hx    Stomach cancer Neg Hx     ROS:  Review of Systems  Constitutional:  Negative for chills, diaphoresis, fever, malaise/fatigue and weight loss.  HENT:  Negative for congestion.   Eyes:  Negative for discharge and redness.  Respiratory:  Negative for cough, sputum production, shortness of breath and wheezing.   Cardiovascular:  Positive for chest pain. Negative for palpitations, orthopnea, claudication, leg swelling and PND.  Gastrointestinal:  Negative for abdominal pain, heartburn, nausea and vomiting.  Musculoskeletal:  Negative for falls and myalgias.  Skin:  Negative for rash.  Neurological:  Positive for headaches. Negative for dizziness, tingling, tremors, sensory change, speech change, focal weakness, loss of consciousness and weakness.   Endo/Heme/Allergies:  Does not bruise/bleed easily.  Psychiatric/Behavioral:  Negative for substance abuse. The patient is not nervous/anxious.   All other systems reviewed and are negative.    Physical Exam/Data:   Vitals:   06/08/21 1000 06/08/21 1030 06/08/21 1100 06/08/21 1130  BP: (!) 146/75 (!) 123/52 (!) 122/52 (!) 114/56  Pulse: 65 65 66 65  Resp: 13 17 18 20   Temp:      TempSrc:      SpO2: 98% 98% 96% 97%  Weight:      Height:       No intake or output data in the 24 hours ending 06/08/21 1239 Filed Weights   06/07/21 2018  Weight: 54.4 kg   Body mass index is 19.97 kg/m.   Physical Exam: General: Well developed, well nourished, in no acute distress. Head: Normocephalic, atraumatic, sclera non-icteric, no xanthomas, nares without discharge.  Neck: Negative for carotid bruits. JVD not elevated. Lungs: Clear bilaterally to auscultation without wheezes, rales, or rhonchi. Breathing is unlabored. Heart: RRR with S1 S2. No murmurs, rubs, or gallops appreciated. Abdomen: Soft, non-tender, non-distended with normoactive bowel sounds. No hepatomegaly. No rebound/guarding. No obvious abdominal masses. Msk:  Strength and tone appear normal for age. Extremities: No clubbing or cyanosis. No edema. Distal pedal pulses are 2+ and equal bilaterally. Neuro: Alert and oriented X 3. No facial asymmetry. No focal deficit. Moves all extremities spontaneously. Psych:  Responds to questions appropriately with a normal affect.   EKG:  The EKG was personally reviewed and demonstrates: NSR, 70 bpm, diffuse J-point ST elevation, PR depression Telemetry:  Telemetry was personally reviewed and demonstrates: SR  Weights: [employer]   06/07/21 2018  Weight: 54.4 kg    Relevant CV Studies:  2D echo 06/08/2021: 1. Left ventricular ejection fraction, by estimation, is 55 to 60%. The  left ventricle has normal function. Left ventricular endocardial border  not optimally defined to  evaluate regional wall motion. Left ventricular  diastolic function could not be  evaluated.   2. Right ventricular systolic function is normal. The right ventricular  size is normal.   3. The mitral valve is grossly normal. No evidence of mitral valve  regurgitation.  4. The aortic valve was not well visualized. Aortic valve regurgitation  is not visualized.   5. The inferior vena cava is normal in size with greater than 50%  respiratory variability, suggesting right atrial pressure of 3 mmHg.  Laboratory Data:  Chemistry Recent Labs  Lab 06/07/21 2022 06/08/21 0348  NA 134* 135  K 3.2* 3.6  CL 100 101  CO2 24 26  GLUCOSE 103* 92  BUN 12 14  CREATININE 1.12 0.94  CALCIUM 9.5 9.0  GFRNONAA >60 >60  ANIONGAP 10 8    No results for input(s): PROT, ALBUMIN, AST, ALT, ALKPHOS, BILITOT in the last 168 hours. Hematology Recent Labs  Lab 06/07/21 2022 06/08/21 0348  WBC 15.1* 12.1*  RBC 4.62 4.27  HGB 15.7 14.5  HCT 43.2 40.8  MCV 93.5 95.6  MCH 34.0 34.0  MCHC 36.3* 35.5  RDW 13.2 13.2  PLT 179 165   Cardiac EnzymesNo results for input(s): TROPONINI in the last 168 hours. No results for input(s): TROPIPOC in the last 168 hours.  BNPNo results for input(s): BNP, PROBNP in the last 168 hours.  DDimer No results for input(s): DDIMER in the last 168 hours.  Radiology/Studies:  CT HEAD WO CONTRAST (5MM)  Result Date: 06/07/2021 IMPRESSION: Unremarkable noncontrast CT of the brain. Electronically Signed   By: Anner Crete M.D.   On: 06/07/2021 21:19   Assessment and Plan:   1.  Chest pain: -Atypical in presentation -High-sensitivity troponin negative x2 -EKG with diffuse J-point ST elevation and PR depression -Echo demonstrated no evidence of pericardial effusion -Continue empiric treatment with colchicine 0.6 mg twice daily with consideration for addition of short course of NSAID -Patient does have risk factors for CAD including ongoing tobacco use, male  status with age 86, HTN, and HLD; consider outpatient Lexiscan MPI   2.  HTN: -Blood pressure currently well controlled -Continue current therapy per primary service  3.  HLD: -LDL 76 on last check -PTA atorvastatin  4.  Tobacco use: -Complete cessation recommended  5.  Headache: -Management per IM   For questions or updates, please contact [place] Please consult [email or web address] for contact info under Cardiology/STEMI.   Signed, Christell Faith, PA-C [place] Pager: [phone] 06/08/2021, 12:39 PM

## 2021-06-08 NOTE — ED Provider Notes (Addendum)
[facility] Emergency Department Provider Note ____________________________________________   Event Date/Time   First MD Initiated Contact with Patient 06/08/21 [phone]     (approximate)  I have reviewed the triage vital signs and the nursing notes.   HISTORY  Chief Complaint Headache    HPI Brad Myers is a 75 y.o. male with PMH as noted below including history of migraine headaches who presents with a headache, acute onset around 3 PM yesterday, gradually worsening, and mainly located on the right side of his head and behind the right eye.  It was constant.  It was not associated with significant photophobia or nausea.  Subsequently the pain radiated down to the right side of his neck, and now he is having pain in the upper part of his chest and lower part of his neck.  He also feels like his throat is sore.  He states that the headache itself has now mostly subsided.  He denies any cough, shortness of breath, fever, or weakness.  He states that the headache is somewhat similar to when he had migraines although his last migraine was about 15 years ago.   Past Medical History:  Diagnosis Date   Arthritis    Headache, migraine 06/24/2015   History of chicken pox 06/24/2015   DID have Chicken Pox. DID have Measles. DID have Mumps.     Hyperlipidemia    Hypertension    Stomach ulcer 06/24/2015    Patient Active Problem List   Diagnosis Date Noted   Chest pain 06/08/2021   Right inguinal hernia 12/27/2019   Osteoarthritis of carpometacarpal (CMC) joint of thumb 12/26/2019   Aortic atherosclerosis ([place]) 04/15/2019   Emphysema lung ([place]) 04/15/2019   Genetic testing 10/19/2018   Family history of brain cancer    Family history of lung cancer    Cecal polyp 09/19/2018   Insomnia 04/21/2017   Coronary artery calcification seen on CAT scan 07/20/2015   Personal history of tobacco use, presenting hazards to health 07/09/2015   Smoking greater than 30 pack  years 06/26/2015   History of adenomatous polyp of colon 06/24/2015   Essential (primary) hypertension 06/24/2015   GERD (gastroesophageal reflux disease) 06/24/2015   HLD (hyperlipidemia) 06/24/2015   Tenosynovitis of hand 06/24/2015    Past Surgical History:  Procedure Laterality Date   CARPOMETACARPAL ([place]) FUSION OF THUMB Left 03/11/2021   Procedure: ARTHROPLASTY, INTERPOSITION/INTERCARPAL/CMC JOINTS (SURG);  Surgeon: Earnestine Leys, MD;  Location: [facility] ORS;  Service: Orthopedics;  Laterality: Left;   COLONOSCOPY WITH PROPOFOL N/A 07/11/2018   Procedure: COLONOSCOPY WITH PROPOFOL;  Surgeon: [name], MD;  Location: [facility] ENDOSCOPY;  Service: Gastroenterology;  Laterality: N/A;   EYE SURGERY Right 2003   HERNIA REPAIR Right 2005    Prior to Admission medications   Medication Sig Start Date End Date Taking? Authorizing Provider  atorvastatin (LIPITOR) 40 MG tablet TAKE 1 TABLET(40 MG) BY MOUTH DAILY Patient taking differently: Take 80 mg by mouth daily. 05/31/19   [name], MD  Cholecalciferol (VITAMIN D3) 1000 units CAPS Take 1,000 Units by mouth every other day.    [provider]  gabapentin (NEURONTIN) 400 MG capsule Take 1 capsule (400 mg total) by mouth 3 (three) times daily. 03/11/21   Earnestine Leys, MD  HYDROcodone-acetaminophen (NORCO) 5-325 MG tablet Take 1-2 tablets by mouth every 6 (six) hours as needed. 03/11/21   Earnestine Leys, MD  meloxicam (MOBIC) 15 MG tablet Take 1 tablet (15 mg total) by mouth daily.  03/11/21   Earnestine Leys, MD  Potassium 99 MG TABS Take 99 mg by mouth every other day.    [provider]  traZODone (DESYREL) 50 MG tablet TAKE 1 TABLET BY MOUTH EVERY NIGHT AT BEDTIME Patient taking differently: Take 50 mg by mouth at bedtime. 02/25/20   [name], MD  triamterene-hydrochlorothiazide (MAXZIDE) 75-50 MG tablet Take 1 tablet by mouth daily.    [provider]    Allergies Patient has no known allergies.  Family  History  Problem Relation Age of Onset   Lung cancer Mother        dx 30's, died 45. unk if 2 primary or met   Brain cancer Mother        dx 25's   Diabetes Father    Coronary artery disease Father    Colon cancer Neg Hx    Esophageal cancer Neg Hx    Inflammatory bowel disease Neg Hx    Liver disease Neg Hx    Pancreatic cancer Neg Hx    Rectal cancer Neg Hx    Stomach cancer Neg Hx     Social History Social History   Tobacco Use   Smoking status: Every Day    Packs/day: 0.50    Years: 55.00    Pack years: 27.50    Types: Cigarettes   Smokeless tobacco: Former    Types: Snuff, Chew  Vaping Use   Vaping Use: Never used  Substance Use Topics   Alcohol use: Yes    Alcohol/week: 12.0 - 20.0 standard drinks    Types: 12 - 20 Cans of beer per week    Comment: drinks 3-4 days a week   Drug use: No    Review of Systems  Constitutional: No fever. Eyes: No redness. ENT: Positive for sore throat. Cardiovascular: Positive for chest pain. Respiratory: Denies shortness of breath. Gastrointestinal: No vomiting or diarrhea.  Genitourinary: Negative for pain. Musculoskeletal: Negative for back pain. Skin: Negative for rash. Neurological: Positive for headaches.  Negative for focal weakness or numbness.   ____________________________________________   PHYSICAL EXAM:  VITAL SIGNS: ED Triage Vitals  Enc Vitals Group     BP 06/07/21 2022 (!) 190/109     Pulse Rate 06/07/21 2022 (!) 108     Resp 06/07/21 2022 20     Temp 06/07/21 2022 97.8 F (36.6 C)     Temp Source 06/07/21 2022 Oral     SpO2 06/07/21 2022 97 %     Weight 06/07/21 2018 120 lb (54.4 kg)     Height 06/07/21 2018 _0  (1.651 m)     Head Circumference --      Peak Flow --      Pain Score 06/07/21 2018 9     Pain Loc --      Pain Edu? --      Excl. in [place]? --     Constitutional: Alert and oriented. Well appearing and in no acute distress. Eyes: Conjunctivae are normal.  Head: Atraumatic. Nose:  No congestion/rhinnorhea. Mouth/Throat: Mucous membranes are moist.   Neck: Normal range of motion.  Supple.  No midline cervical spinal tenderness.  No meningeal signs.  No cervical lymphadenopathy, palpable masses, swelling, or tenderness. Cardiovascular: Normal rate, regular rhythm. Grossly normal heart sounds.  Good peripheral circulation. Respiratory: Normal respiratory effort.  No retractions. Lungs CTAB. Gastrointestinal: No distention.  Musculoskeletal: No lower extremity edema.  Extremities warm and well perfused.  Neurologic:  Normal speech and language.  5/5 motor strength and intact sensation to all extremities (except for decreased grip strength to left arm due to old surgery).  No facial droop.  No pronator drift. Skin:  Skin is warm and dry. No rash noted. Psychiatric: Mood and affect are normal. Speech and behavior are normal.  ____________________________________________   LABS (all labs ordered are listed, but only abnormal results are displayed)  Labs Reviewed  BASIC METABOLIC PANEL - Abnormal; Notable for the following components:      Result Value   Sodium 134 (*)    Potassium 3.2 (*)    Glucose, Bld 103 (*)    All other components within normal limits  CBC - Abnormal; Notable for the following components:   WBC 15.1 (*)    MCHC 36.3 (*)    All other components within normal limits  RESP PANEL BY RT-PCR (FLU A&B, COVID) ARPGX2  GROUP A STREP BY PCR  TROPONIN I (HIGH SENSITIVITY)  TROPONIN I (HIGH SENSITIVITY)   ____________________________________________  EKG  ED ECG REPORT I, Arta Silence, the attending physician, personally viewed and interpreted this ECG.  Date: 06/08/2021 EKG Time: 0203 Rate: 70 Rhythm: normal sinus rhythm QRS Axis: normal Intervals: normal ST/T Wave abnormalities: Diffuse ST elevation when compared to EKG of 03/10/2021 Narrative Interpretation: Nonspecific diffuse ST elevations    ____________________________________________  RADIOLOGY  CT head: No ICH or other acute abnormality  ____________________________________________   PROCEDURES  Procedure(s) performed: No  Procedures  Critical Care performed: No ____________________________________________   INITIAL IMPRESSION / ASSESSMENT AND PLAN / ED COURSE  Pertinent labs & imaging results that were available during my care of the patient were reviewed by me and considered in my medical decision making (see chart for details).   75 year old male with PMH as noted above presents with initially a headache on the right side of his head then radiating down to the neck, and now with upper chest pain and sore throat.  He states the headache is mostly resolved.  I reviewed the past medical records in [place].  The patient was most recently seen in May for unrelated symptoms of wrist pain.  He has no other recent ED visits or admissions.  On exam the patient is well-appearing.  His vital signs are normal except for hypertension.  Neurologic exam is normal.  There is no lymphadenopathy, swelling, or tenderness to the neck, and it is supple with full range of motion.  Differential includes migraine, tension headache, cluster, muscle strain/spasm, torticollis, pharyngitis or other upper respiratory infection, or less likely COVID-19.  Given the normal neurologic exam and migrating nature of the pain I do not suspect vascular etiology.  CT head and basic labs obtained from triage are unremarkable except for mild leukocytosis.  I will add on EKG and troponins now that the patient is having chest pain, as well as a COVID and strep swab and NSAIDs for analgesia.  ----------------------------------------- 2:22 AM on 06/08/2021 -----------------------------------------  EKG shows diffuse ST elevations which appear new when compared to his EKG from 03/10/2020 although the overall morphology is the same and the ST elevations are  not localized to any coronary artery distribution.  There are no reciprocal changes.  I consulted Dr. Stanford Breed from cardiology who reviewed the EKGs and agrees that there is no indication to take the patient to the Cath Lab emergently.  Initial troponin is negative.  This could represent pericarditis.  Given the patient's well appearance, reassuring vital signs, and the location of pain I do not  suspect aortic dissection or other vascular cause.  ----------------------------------------- 3:34 AM on 06/08/2021 -----------------------------------------  The patient reports significant improvement in his symptoms after Toradol.  Given the abnormal EKG and cardiology recommendations I consulted Dr. Sidney Ace for admission to the hospital service.  ____________________________________________   FINAL CLINICAL IMPRESSION(S) / ED DIAGNOSES  Final diagnoses:  Atypical chest pain  Acute nonintractable headache, unspecified headache type  Abnormal EKG      NEW MEDICATIONS STARTED DURING THIS VISIT:  New Prescriptions   No medications on file     Note:  This document was prepared using Dragon voice recognition software and may include unintentional dictation errors.    Arta Silence, MD 06/08/21 [id number]    Arta Silence, MD 06/08/21 [phone]

## 2021-06-08 NOTE — H&P (Signed)
[place]   PATIENT NAME: Brad Myers    MR#:  [id number]  DATE OF BIRTH:  Aug 11, 1946  DATE OF ADMISSION:  06/08/2021  PRIMARY CARE PHYSICIAN: [name], MD   Patient is coming from: Home  REQUESTING/REFERRING PHYSICIAN: Arta Silence, MD  CHIEF COMPLAINT:   Chief Complaint  Patient presents with   Headache    HISTORY OF PRESENT ILLNESS:  Brad Myers is a 75 y.o. Caucasian male with medical history significant for hypertension, dyslipidemia, osteoarthritis and migraine, who presented to the emergency room with acute onset of left-sided periorbital headache That felt like migraine followed by neck pain and then he had midsternal chest pain graded 6/10 in severity and without radiation, nausea or vomiting or diaphoresis.  He denied any associated dyspnea or palpitations.  No cough or wheezing.  He has been vaccinated for COVID-19 and had a booster.  He denies any dysuria, oliguria or hematuria or flank pain.  No leg pain or edema or recent travels or surgeries.  ED Course: Upon presenting to the emergency room blood pressure was 190/109 and later 169/77 with a heart rate of 108 alert 73 and otherwise normal vital signs.  Labs revealed sodium 134 with potassium of 3.2.  High-sensitivity troponin I was 7 twice and CBC showed leukocytosis of 15.1.  Influenza antigens and COVID-19 PCR came back negative.    EKG as reviewed by me : Showed normal sinus rhythm with a rate of 70 with early repolarization and in V3 through V5 with J-point elevation and ST elevation in the inferior leads and T wave inversion in aVL as well as V1.  H Imaging: Noncontrasted head CT scan revealed no acute intracranial abnormalities.  The patient was given 30 mg IV Toradol.  He will be admitted to observation PCU bed for further evaluation and management. PAST MEDICAL HISTORY:   Past Medical History:  Diagnosis Date   Arthritis    Headache, migraine 06/24/2015   History of chicken pox  06/24/2015   DID have Chicken Pox. DID have Measles. DID have Mumps.     Hyperlipidemia    Hypertension    Stomach ulcer 06/24/2015    PAST SURGICAL HISTORY:   Past Surgical History:  Procedure Laterality Date   CARPOMETACARPAL ([place]) FUSION OF THUMB Left 03/11/2021   Procedure: ARTHROPLASTY, INTERPOSITION/INTERCARPAL/CMC JOINTS (SURG);  Surgeon: Earnestine Leys, MD;  Location: [facility] ORS;  Service: Orthopedics;  Laterality: Left;   COLONOSCOPY WITH PROPOFOL N/A 07/11/2018   Procedure: COLONOSCOPY WITH PROPOFOL;  Surgeon: [name], MD;  Location: [facility] ENDOSCOPY;  Service: Gastroenterology;  Laterality: N/A;   EYE SURGERY Right 2003   HERNIA REPAIR Right 2005    SOCIAL HISTORY:   Social History   Tobacco Use   Smoking status: Every Day    Packs/day: 0.50    Years: 55.00    Pack years: 27.50    Types: Cigarettes   Smokeless tobacco: Former    Types: Snuff, Chew  Substance Use Topics   Alcohol use: Yes    Alcohol/week: 12.0 - 20.0 standard drinks    Types: 12 - 20 Cans of beer per week    Comment: drinks 3-4 days a week    FAMILY HISTORY:   Family History  Problem Relation Age of Onset   Lung cancer Mother        dx 82's, died 19. unk if 2 primary or met   Brain cancer Mother        dx  22's   Diabetes Father    Coronary artery disease Father    Colon cancer Neg Hx    Esophageal cancer Neg Hx    Inflammatory bowel disease Neg Hx    Liver disease Neg Hx    Pancreatic cancer Neg Hx    Rectal cancer Neg Hx    Stomach cancer Neg Hx     DRUG ALLERGIES:  No Known Allergies  REVIEW OF SYSTEMS:   ROS As per history of present illness. All pertinent systems were reviewed above. Constitutional, HEENT, cardiovascular, respiratory, GI, GU, musculoskeletal, neuro, psychiatric, endocrine, integumentary and hematologic systems were reviewed and are otherwise negative/unremarkable except for positive findings mentioned above in the HPI.   MEDICATIONS AT HOME:   Prior to  Admission medications   Medication Sig Start Date End Date Taking? Authorizing Provider  atorvastatin (LIPITOR) 40 MG tablet TAKE 1 TABLET(40 MG) BY MOUTH DAILY Patient taking differently: Take 40 mg by mouth daily. 05/31/19  Yes [name], MD  Potassium 99 MG TABS Take 99 mg by mouth every other day.   Yes [provider]  PROLENSA 0.07 % SOLN Apply 1 drop to eye 2 (two) times daily. 04/23/21  Yes [provider]  traZODone (DESYREL) 50 MG tablet TAKE 1 TABLET BY MOUTH EVERY NIGHT AT BEDTIME Patient taking differently: Take 50 mg by mouth at bedtime. 02/25/20  Yes [name], MD  triamterene-hydrochlorothiazide (MAXZIDE) 75-50 MG tablet Take 1 tablet by mouth daily.   Yes [provider]  Cholecalciferol (VITAMIN D3) 1000 units CAPS Take 1,000 Units by mouth every other day. Patient not taking: Reported on 06/08/2021    [provider]  gabapentin (NEURONTIN) 400 MG capsule Take 1 capsule (400 mg total) by mouth 3 (three) times daily. Patient not taking: No sig reported 03/11/21   Earnestine Leys, MD  HYDROcodone-acetaminophen [facility]) 5-325 MG tablet Take 1-2 tablets by mouth every 6 (six) hours as needed. Patient not taking: No sig reported 03/11/21   Earnestine Leys, MD  meloxicam (MOBIC) 15 MG tablet Take 1 tablet (15 mg total) by mouth daily. Patient not taking: No sig reported 03/11/21   Earnestine Leys, MD      VITAL SIGNS:  Blood pressure (!) 123/53, pulse 64, temperature 97.8 F (36.6 C), temperature source Oral, resp. rate 17, height 5' 5"  (1.651 m), weight 54.4 kg, SpO2 97 %.  PHYSICAL EXAMINATION:  Physical Exam  GENERAL:  75 y.o.-year-old Caucasian male patient lying in the bed with no acute distress.  EYES: Pupils equal, round, reactive to light and accommodation. No scleral icterus. Extraocular muscles intact.  HEENT: Head atraumatic, normocephalic. Oropharynx and nasopharynx clear.  NECK:  Supple, no jugular venous distention. No  thyroid enlargement, no tenderness.  LUNGS: Normal breath sounds bilaterally, no wheezing, rales,rhonchi or crepitation. No use of accessory muscles of respiration.  CARDIOVASCULAR: Regular rate and rhythm, S1, S2 normal. No murmurs, rubs, or gallops.  ABDOMEN: Soft, nondistended, nontender. Bowel sounds present. No organomegaly or mass.  EXTREMITIES: No pedal edema, cyanosis, or clubbing.  NEUROLOGIC: Cranial nerves II through XII are intact. Muscle strength 5/5 in all extremities. Sensation intact. Gait not checked.  PSYCHIATRIC: The patient is alert and oriented x 3.  Normal affect and good eye contact. SKIN: No obvious rash, lesion, or ulcer.   LABORATORY PANEL:   CBC Recent Labs  Lab 06/07/21 2022  WBC 15.1*  HGB 15.7  HCT 43.2  PLT 179   ------------------------------------------------------------------------------------------------------------------  Chemistries  Recent Labs  Lab 06/07/21 2022  NA 134*  K 3.2*  CL 100  CO2 24  GLUCOSE 103*  BUN 12  CREATININE 1.12  CALCIUM 9.5   ------------------------------------------------------------------------------------------------------------------  Cardiac Enzymes No results for input(s): TROPONINI in the last 168 hours. ------------------------------------------------------------------------------------------------------------------  RADIOLOGY:  CT HEAD WO CONTRAST (5MM)  Result Date: 06/07/2021 CLINICAL DATA:  Headache neck pain.  Transient ischemic attack. EXAM: CT HEAD WITHOUT CONTRAST TECHNIQUE: Contiguous axial images were obtained from the base of the skull through the vertex without intravenous contrast. COMPARISON:  None. FINDINGS: Brain: The ventricles and sulci appropriate size for patient's age. The gray-white matter discrimination is preserved. There is no acute intracranial hemorrhage. No mass effect or midline shift no extra-axial fluid collection. A subcentimeter high attenuating focus along the right  frontal lobe (23/2) likely a small calcified meningioma. Vascular: No hyperdense vessel or unexpected calcification. Skull: Normal. Negative for fracture or focal lesion. Sinuses/Orbits: No acute finding. Other: None IMPRESSION: Unremarkable noncontrast CT of the brain. Electronically Signed   By: Anner Crete M.D.   On: 06/07/2021 21:19      IMPRESSION AND PLAN:  Active Problems:   Chest pain  1.  Chest pain, rule out acute coronary syndrome.  Patient has abnormal EKG concerning for acute pericarditis.  His chest pain could be related to elevated BP as well - The patient will be admitted to an observation PCU bed. - We will follow serial troponin I's. - The patient will be placed on aspirin as well as as needed sublingual nitroglycerin and morphine sulfate for pain. - Cardiology consult to be obtained. - I notified Dr. Stanford Breed about the patient. -The patient was given IV Toradol and will be placed on as needed.  2.  Hypertensive urgency. - We will continue Maxzide and place the patient on as needed IV labetalol.  3.  Dyslipidemia. - We will continue statin therapy.  4.  Peripheral neuropathy. - We will continue Neurontin.   - DVT prophylaxis: Lovenox. Code Status: full code. Family Communication:  The plan of care was discussed in details with the patient (and family). I answered all questions. The patient agreed to proceed with the above mentioned plan. Further management will depend upon hospital course. Disposition Plan: Back to previous home environment Consults called: Cardiology All the records are reviewed and case discussed with ED provider.  Status is: Observation  The patient remains OBS appropriate and will d/c before 2 midnights.  Dispo: The patient is from: Home              Anticipated d/c is to: Home              Patient currently is not medically stable to d/c.   Difficult to place patient No   TOTAL TIME TAKING CARE OF THIS PATIENT: 55 minutes.     Christel Mormon M.D on 06/08/2021 at 4:55 AM  [facility]   From 7 PM-7 AM, contact night-coverage [email or web address]  CC: Primary care physician; [name], MD

## 2021-06-24 DIAGNOSIS — Z961 Presence of intraocular lens: Secondary | ICD-10-CM | POA: Diagnosis not present

## 2021-06-28 ENCOUNTER — Ambulatory Visit: Payer: Medicare Other | Admitting: Family Medicine

## 2021-06-28 ENCOUNTER — Encounter: Payer: Self-pay | Admitting: Family Medicine

## 2021-06-28 ENCOUNTER — Ambulatory Visit (INDEPENDENT_AMBULATORY_CARE_PROVIDER_SITE_OTHER): Payer: Medicare Other | Admitting: Family Medicine

## 2021-06-28 ENCOUNTER — Other Ambulatory Visit: Payer: Self-pay

## 2021-06-28 VITALS — BP 129/65 | HR 83 | Temp 97.3°F | Wt 121.0 lb

## 2021-06-28 DIAGNOSIS — R079 Chest pain, unspecified: Secondary | ICD-10-CM | POA: Diagnosis not present

## 2021-06-28 DIAGNOSIS — Z23 Encounter for immunization: Secondary | ICD-10-CM | POA: Diagnosis not present

## 2021-06-28 NOTE — Progress Notes (Signed)
Established patient visit   Patient: Brad Myers   DOB: 08-01-1946   75 y.o. Male  MRN: [id number] Visit Date: 06/28/2021  Today's healthcare provider: Lelon Huh, MD   Chief Complaint  Patient presents with   Follow-up   Headache   Chest Pain    Subjective    HPI  Follow up ER visit  Patient was seen in ER for headache and chest pain on 06/08/21. He had negative CT of the head. There were diffuse EKG changes but troponin was normal. He was treated for suspected pericarditis.  Treatment for this included injection of Toradol and prescription for colchicine.  He reports excellent compliance with treatment. He reports this condition is Improved. He is scheduled to follow up with cardiology on 07/05/2021    Medications: Outpatient Medications Prior to Visit  Medication Sig   aspirin EC 325 MG EC tablet Take 1 tablet (325 mg total) by mouth daily.   atorvastatin (LIPITOR) 40 MG tablet TAKE 1 TABLET(40 MG) BY MOUTH DAILY (Patient taking differently: Take 40 mg by mouth daily.)   Cholecalciferol (VITAMIN D3) 1000 units CAPS Take 1,000 Units by mouth every other day.   colchicine 0.6 MG tablet Take 1 tablet (0.6 mg total) by mouth 2 (two) times daily.   Potassium 99 MG TABS Take 99 mg by mouth every other day.   PROLENSA 0.07 % SOLN Apply 1 drop to eye 2 (two) times daily.   traZODone (DESYREL) 50 MG tablet TAKE 1 TABLET BY MOUTH EVERY NIGHT AT BEDTIME (Patient taking differently: Take 50 mg by mouth at bedtime.)   gabapentin (NEURONTIN) 400 MG capsule Take 1 capsule (400 mg total) by mouth 3 (three) times daily. (Patient not taking: No sig reported)   triamterene-hydrochlorothiazide (MAXZIDE) 75-50 MG tablet Take 1 tablet by mouth daily.   No facility-administered medications prior to visit.    Review of Systems  Constitutional: Negative.   Respiratory: Negative.    Cardiovascular: Negative.   Gastrointestinal: Negative.   Neurological:  Positive for headaches  (But has improved.). Negative for dizziness and light-headedness.      Objective    BP 129/65 (BP Location: Right Arm, Patient Position: Sitting, Cuff Size: Normal)   Pulse 83   Temp (!) 97.3 F (36.3 C) (Oral)   Wt 121 lb (54.9 kg)   SpO2 100%   BMI 20.14 kg/m    Physical Exam   General: Appearance:    Well developed, well nourished male in no acute distress  Eyes:    PERRL, conjunctiva/corneas clear, EOM's intact       Lungs:     Clear to auscultation bilaterally, respirations unlabored  Heart:    Normal heart rate. Normal rhythm. No murmurs, rubs, or gallops.    MS:   All extremities are intact.    Neurologic:   Awake, alert, oriented x 3. No apparent focal neurological defect.         Assessment & Plan     1. Need for influenza vaccination  - Flu Vaccine QUAD High Dose(Fluad)  2. Chest pain, unspecified type ER work up negative for cardiac anginal. Suspected pericarditis per ER records. Symptoms have completely resolved and not recurred since ER visit.          The entirety of the information documented in the History of Present Illness, Review of Systems and Physical Exam were personally obtained by me. Portions of this information were initially documented by the [place] and reviewed by  me for thoroughness and accuracy.     Lelon Huh, MD  [facility] [phone] (phone) [phone] (fax)  [place]

## 2021-06-28 NOTE — Progress Notes (Deleted)
      Established patient visit   Patient: Brad Myers   DOB: 1946-09-26   75 y.o. Male  MRN: [id number] Visit Date: 06/28/2021  Today's healthcare provider: Wilhemena Durie, MD   No chief complaint on file.  Subjective    HPI  Follow up ER visit  Patient was seen in ER for headache and chest pain on 06/08/21. He was treated for chest pain. Treatment for this included Toradol. He reports {DESC; EXCELLENT/GOOD/FAIR:19992} compliance with treatment. He reports this condition is {improved/worse/unchanged:3041574}.   {Link to patient history deactivated due to formatting error:1}  Medications: Outpatient Medications Prior to Visit  Medication Sig  . aspirin EC 325 MG EC tablet Take 1 tablet (325 mg total) by mouth daily.  Marland Kitchen atorvastatin (LIPITOR) 40 MG tablet TAKE 1 TABLET(40 MG) BY MOUTH DAILY (Patient taking differently: Take 40 mg by mouth daily.)  . Cholecalciferol (VITAMIN D3) 1000 units CAPS Take 1,000 Units by mouth every other day. (Patient not taking: Reported on 06/08/2021)  . colchicine 0.6 MG tablet Take 1 tablet (0.6 mg total) by mouth 2 (two) times daily.  Marland Kitchen gabapentin (NEURONTIN) 400 MG capsule Take 1 capsule (400 mg total) by mouth 3 (three) times daily. (Patient not taking: No sig reported)  . Potassium 99 MG TABS Take 99 mg by mouth every other day.  Marland Kitchen PROLENSA 0.07 % SOLN Apply 1 drop to eye 2 (two) times daily.  . traZODone (DESYREL) 50 MG tablet TAKE 1 TABLET BY MOUTH EVERY NIGHT AT BEDTIME (Patient taking differently: Take 50 mg by mouth at bedtime.)  . triamterene-hydrochlorothiazide (MAXZIDE) 75-50 MG tablet Take 1 tablet by mouth daily.   No facility-administered medications prior to visit.    Review of Systems  Constitutional:  Negative for activity change and fatigue.  Respiratory:  Negative for cough and shortness of breath.   Cardiovascular:  Negative for chest pain, palpitations and leg swelling.  Musculoskeletal:  Negative for arthralgias and  myalgias.  Neurological:  Negative for dizziness, light-headedness and headaches.  Psychiatric/Behavioral:  Negative for agitation, self-injury, sleep disturbance and suicidal ideas. The patient is not nervous/anxious.    {Labs  Heme  Chem  Endocrine  Serology  Results Review (optional):23779}   Objective    There were no vitals taken for this visit. {Show previous vital signs (optional):23777}  Physical Exam  ***  No results found for any visits on 06/28/21.  Assessment & Plan     ***  No follow-ups on file.      {provider attestation***:1}   Wilhemena Durie, MD  [facility] [phone] (phone) [phone] (fax)  [place]

## 2021-07-05 ENCOUNTER — Encounter: Payer: Self-pay | Admitting: Physician Assistant

## 2021-07-05 ENCOUNTER — Ambulatory Visit: Payer: Medicare Other | Admitting: Physician Assistant

## 2021-07-05 ENCOUNTER — Other Ambulatory Visit: Payer: Self-pay

## 2021-07-05 VITALS — BP 136/70 | HR 72 | Ht 65.0 in | Wt 119.0 lb

## 2021-07-05 DIAGNOSIS — I10 Essential (primary) hypertension: Secondary | ICD-10-CM | POA: Diagnosis not present

## 2021-07-05 DIAGNOSIS — E782 Mixed hyperlipidemia: Secondary | ICD-10-CM | POA: Diagnosis not present

## 2021-07-05 DIAGNOSIS — R9431 Abnormal electrocardiogram [ECG] [EKG]: Secondary | ICD-10-CM

## 2021-07-05 DIAGNOSIS — R0789 Other chest pain: Secondary | ICD-10-CM | POA: Diagnosis not present

## 2021-07-05 NOTE — Patient Instructions (Signed)
Medication Instructions:  - Your physician has recommended you make the following change in your medication:   1) STOP colchicine   *If you need a refill on your cardiac medications before your next appointment, please call your pharmacy*   Lab Work: - none ordered  If you have labs (blood work) drawn today and your tests are completely normal, you will receive your results only by: [place] (if you have MyChart) OR A paper copy in the mail If you have any lab test that is abnormal or we need to change your treatment, we will call you to review the results.   Testing/Procedures: - none ordered   Follow-Up: At [facility], you and your health needs are our priority.  As part of our continuing mission to provide you with exceptional heart care, we have created designated Provider Care Teams.  These Care Teams include your primary Cardiologist (physician) and Advanced Practice Providers (APPs -  Physician Assistants and Nurse Practitioners) who all work together to provide you with the care you need, when you need it.  We recommend signing up for the patient portal called "MyChart".  Sign up information is provided on this After Visit Summary.  MyChart is used to connect with patients for Virtual Visits (Telemedicine).  Patients are able to view lab/test results, encounter notes, upcoming appointments, etc.  Non-urgent messages can be sent to your provider as well.   To learn more about what you can do with MyChart, go to [email or web address].    Your next appointment:   As needed   The format for your next appointment:   In Person  Provider:   You may see Nelva Bush, MD or one of the following Advanced Practice Providers on your designated Care Team:   Murray Hodgkins, NP Christell Faith, PA-C Marrianne Mood, PA-C Cadence Kathlen Mody, [place]   Other Instructions N/a

## 2021-07-05 NOTE — Progress Notes (Signed)
Office Visit    Patient Name: Brad Myers Date of Encounter: 07/05/2021  PCP:  [name], MD   [place]  Cardiologist: New, Dr. End Advanced Practice Provider:  No care team member to display Electrophysiologist:  None :[phone]}   Chief Complaint    Chief Complaint  Patient presents with   Hospitalization Follow-up    75 y.o. male with history of hypertension, hyperlipidemia, osteoarthritis, headache disorder, ongoing tobacco use, bilateral cataracts, and recently seen in the hospital for evaluation of chest pain and possible pericarditis started on colchicine.  Past Medical History    Past Medical History:  Diagnosis Date   Arthritis    Headache, migraine 06/24/2015   History of chicken pox 06/24/2015   DID have Chicken Pox. DID have Measles. DID have Mumps.     Hyperlipidemia    Hypertension    Stomach ulcer 06/24/2015   Past Surgical History:  Procedure Laterality Date   CARPOMETACARPAL ([place]) FUSION OF THUMB Left 03/11/2021   Procedure: ARTHROPLASTY, INTERPOSITION/INTERCARPAL/CMC JOINTS (SURG);  Surgeon: Earnestine Leys, MD;  Location: [facility] ORS;  Service: Orthopedics;  Laterality: Left;   COLONOSCOPY WITH PROPOFOL N/A 07/11/2018   Procedure: COLONOSCOPY WITH PROPOFOL;  Surgeon: [name], MD;  Location: [facility] ENDOSCOPY;  Service: Gastroenterology;  Laterality: N/A;   EYE SURGERY Right 2003   HERNIA REPAIR Right 2005    Allergies  No Known Allergies  History of Present Illness    Brad Myers is a 75 y.o. male with PMH as above.  He does report a family history of heart disease in his 72, which died from a heart attack at 36, and brother 28, 43. At presentation to [facility], he had no previously known cardiac history.  He underwent cataract surgery 04/28/2021 and 05/29/2021.  He reported left frontal lobe headaches after cataract surgery.  When his headaches persisted, he presented to the ED.  While in the ED, he noted his headache  radiated down the right eye, right side of his neck, and into his chest.  EKG showed NSR and J-point elevation with PR depression.  High-sensitivity troponin negative.  Potassium 3.6.  Rapid strep negative.  COVID-19 negative.  He received ASA 325 mg x 1 and ketorolac with resolution of chest pain.  He denied any exertional symptoms.  He reported tobacco use at 1/2 pack/day x 55 years.  He was started on colchicine for empiric treatment of possible pericarditis.  Echo was limited by suboptimal parasternal and apical windows.  Showed EF 55 to 60% and no significant valvular abnormalities or pericardial effusion.  It was recommended he continue colchicine 0.6 mg twice daily until follow-up in the office, as well as ibuprofen 800 mg 3 times daily.  PPI for GI prophylaxis recommended if planning to use NSAIDs on a regular basis.  Possible MPI/further ischemic work-up at follow-up if needed.  Today, 07/05/21, he returns to clinic and denies any chest pain. He attributes his recent admission to the eye and headache pain that resulted after his cataract surgery.  He wonders if he can discontinue colchicine, as he reports that it is difficult to swallow and also get stuck in his teeth.  He is unclear as to the reason he was started on colchicine with EKG findings at admission and pericarditis etiology reviewed in detail.  He has not been taking any ibuprofen.  He denies any concerning chest pain or shortness of breath at rest or with exertion.  No diarrhea.  He denies any further headache.  He reports that he remains active and is able to walk at least 2 miles 3 times per week without any symptoms.  We discussed repeat blood work and possible stress test, as discussed during his admission, and with patient preference to defer both as he is not concerned by his symptoms.he is aware that he can contact the office if any future chest pain in the future.  Previous low potassium reviewed with patient report that he takes an OTC  potassium supplement every other day with his triamterene-HCTZ and has for some time.  EtOH cessation reviewed, especially given low potassium and history of GERD.  Also given history of GERD, high dose ASA not recommended unless history of stroke or prescribed by neurology. He reports ongoing tobacco use but does not have any desire to quit with risks of ongoing use reviewed today and patient understanding.  Family history also discussed and added to HPI as above.   Further recommendations as below.  Home Medications   Current Outpatient Medications  Medication Instructions   aspirin 325 mg, Oral, Daily   atorvastatin (LIPITOR) 40 MG tablet TAKE 1 TABLET(40 MG) BY MOUTH DAILY   colchicine 0.6 mg, Oral, 2 times daily   Potassium 99 mg, Oral, Every other day   PROLENSA 0.07 % SOLN 1 drop, Ophthalmic, 2 times daily   traZODone (DESYREL) 50 MG tablet TAKE 1 TABLET BY MOUTH EVERY NIGHT AT BEDTIME   triamterene-hydrochlorothiazide (MAXZIDE) 75-50 MG tablet 1 tablet, Oral, Daily   Vitamin D3 1,000 Units, Oral, Every other day     Review of Systems    He denies chest pain, palpitations, dyspnea, pnd, orthopnea, n, v, dizziness, syncope, edema, weight gain, or early satiety.  He reports a desire to discontinue colchicine.   All other systems reviewed and are otherwise negative except as noted above.  Physical Exam    VS:  BP 136/70 (BP Location: Left Arm, Patient Position: Sitting, Cuff Size: Normal)   Pulse 72   Ht 5\' 5"  (1.651 m)   Wt 119 lb (54 kg)   SpO2 98%   BMI 19.80 kg/m  , BMI Body mass index is 19.8 kg/m. GEN: Well nourished, well developed, in no acute distress. HEENT: normal. Neck: Supple, no JVD, carotid bruits, or masses. Cardiac: RRR, no murmurs, rubs, or gallops. No clubbing, cyanosis, edema.  Radials/DP/PT 2+ and equal bilaterally.  Respiratory:  Coarse breath sounds bilaterally, faint bibasilar crackles appreciated GI: Soft, nontender, nondistended, BS + x 4. MS: no  deformity or atrophy. Skin: warm and dry, no rash. Neuro:  Strength and sensation are intact. Psych: Normal affect.  Accessory Clinical Findings    ECG personally reviewed by me today -NSR, 72 bpm, improvement of previous inferior and anterior lateral J-point elevation and PR depression as noted on [facility] EKG- no acute changes.  VITALS Reviewed today   Temp Readings from Last 3 Encounters:  06/28/21 (!) 97.3 F (36.3 C) (Oral)  06/07/21 97.8 F (36.6 C) (Oral)  03/11/21 (!) 97 F (36.1 C) (Temporal)   BP Readings from Last 3 Encounters:  07/05/21 136/70  06/28/21 129/65  06/08/21 119/67   Pulse Readings from Last 3 Encounters:  07/05/21 72  06/28/21 83  06/08/21 81    Wt Readings from Last 3 Encounters:  07/05/21 119 lb (54 kg)  06/28/21 121 lb (54.9 kg)  06/07/21 120 lb (54.4 kg)     LABS  reviewed today   Lab Results  Component  Value Date   WBC 12.1 (H) 06/08/2021   HGB 14.5 06/08/2021   HCT 40.8 06/08/2021   MCV 95.6 06/08/2021   PLT 165 06/08/2021   Lab Results  Component Value Date   CREATININE 0.94 06/08/2021   BUN 14 06/08/2021   NA 135 06/08/2021   K 3.6 06/08/2021   CL 101 06/08/2021   CO2 26 06/08/2021   Lab Results  Component Value Date   ALT 26 05/07/2020   AST 12 (A) 05/07/2020   ALKPHOS 63 05/07/2020   BILITOT 0.5 07/02/2019   Lab Results  Component Value Date   CHOL 156 05/07/2020   HDL 57 05/07/2020   LDLCALC 76 05/07/2020   TRIG 113 05/07/2020   CHOLHDL 2.8 07/02/2019    Lab Results  Component Value Date   HGBA1C 5.1 04/21/2017   Lab Results  Component Value Date   TSH 1.66 02/12/2012     STUDIES/PROCEDURES reviewed today   Echo 06/08/2021  1. Left ventricular ejection fraction, by estimation, is 55 to 60%. The  left ventricle has normal function. Left ventricular endocardial border  not optimally defined to evaluate regional wall motion. Left ventricular  diastolic function could not be  evaluated.   2. Right  ventricular systolic function is normal. The right ventricular  size is normal.   3. The mitral valve is grossly normal. No evidence of mitral valve  regurgitation.   4. The aortic valve was not well visualized. Aortic valve regurgitation  is not visualized.   5. The inferior vena cava is normal in size with greater than 50%  respiratory variability, suggesting right atrial pressure of 3 mmHg.   Assessment & Plan    Atypical chest pain, resolved J point elevation / PR depression, resolved --Recent admission for headache that eventually radiated to chest.  During admission, high-sensitivity troponin negative.  EKG with inferior and anterior lateral J-point elevation and PR depression.  Echo without reduced EF, valvular dysfunction, or pericardial effusion.  He was started on empiric treatment for pericarditis with colchicine 0.6 mg twice daily.  Also recommended was short course of NSAID, which patient reports he has not been taking since discharge.  He denies any chest pain since discharge and request to discontinue colchicine.  EKG today shows improvement/resolution of previous J-point elevation and PR depression.  We did discuss possible MPI, given his risk factors for CAD, including ongoing tobacco use, age, male, hypertension, hyperlipidemia and family history.  He declines further ischemic work-up at this time and reports low concern for CAD or coronary insufficiency but is agreeable to reach out to the office if any recurrent chest pain.  Will defer MPI given no concerning sx at this time. Repeat labs to check electrolytes declined as below with pt agreeable to get with his PCP at follow-up.  Avoid high dose ASA given history of GERD. We reviewed return precautions with patient understanding.  Essential hypertension --BP today borderline with patient preference to avoid any medication changes or labs.  Continue current medications /  triamterene-HCTZ with follow-up BP checks and labs as needed  per PCP.  Recommend goal BP 130/80 or lower. Low salt diet and fluid restrictions reviewed and recommended.  HLD --Continue atorvastatin.  Reviewed goal LDL. Recommend annual LDL / ALT check per PCP.  History of hypokalemia --K 3.6 at discharge on triamterene-HCTZ. We discussed low potassium during admission with patient preference to avoid repeat labs.  He does have OTC potassium supplements, which he reports he takes every other  day with his triamterene-HCTZ. As he declines labs today, pt encouraged to not miss any doses of his OTC supplement as taken in the past.  He will contact his PCP if tachypalpitations in the future for repeat labs but denies any today. Avoid EtOH, as this can also lead to low K as discussed.   Tobacco use, EtOH use --Complete cessation recommended with patient preference to continue smoking and EtOH.  He reports that he has no desire to quit smoking or stop drinking at this time.  Previous headache, resolved --He attributes his previous headache to his cataract surgery and reports that it has been resolved.  Follow-up per PCP if headache recurs in the future.   Disposition: Declines MPI and labs. He will call the office if any concerning sx in the future as reviewed today. Follow-up as needed with return precautions reviewed.  *Please be aware that the above documentation was completed voice recognition software and may contain dictation errors.    Arvil Chaco, PA-C 07/05/2021

## 2021-08-12 DIAGNOSIS — H532 Diplopia: Secondary | ICD-10-CM | POA: Diagnosis not present

## 2021-09-07 ENCOUNTER — Other Ambulatory Visit: Payer: Self-pay

## 2021-09-07 ENCOUNTER — Ambulatory Visit: Payer: Medicare Other | Attending: Internal Medicine

## 2021-09-07 DIAGNOSIS — Z23 Encounter for immunization: Secondary | ICD-10-CM

## 2021-09-07 MED ORDER — PFIZER COVID-19 VAC BIVALENT 30 MCG/0.3ML IM SUSP
INTRAMUSCULAR | 0 refills | Status: DC
  Filled 2021-09-07: qty 0.3, 1d supply, fill #0

## 2021-09-07 NOTE — Progress Notes (Signed)
   Covid-19 Vaccination Clinic  Name:  ALBARO DEVINEY    MRN: [id number] DOB: 05/20/1946  09/07/2021  Mr. Todorov was observed post Covid-19 immunization for 15 minutes without incident. He was provided with Vaccine Information Sheet and instruction to access the V-Safe system.   Mr. Gregori was instructed to call 911 with any severe reactions post vaccine: Difficulty breathing  Swelling of face and throat  A fast heartbeat  A bad rash all over body  Dizziness and weakness   Immunizations Administered     Name Date Dose VIS Date Route   Pfizer Covid-19 Vaccine Bivalent Booster 09/07/2021 10:07 AM 0.3 mL 06/09/2021 Intramuscular   Manufacturer: [place]   Lot: [id number]   Betsy Layne: [place], PharmD, MBA Clinical Acute Care Pharmacist

## 2021-10-15 DIAGNOSIS — Z9889 Other specified postprocedural states: Secondary | ICD-10-CM | POA: Diagnosis not present

## 2021-10-15 DIAGNOSIS — H532 Diplopia: Secondary | ICD-10-CM | POA: Diagnosis not present

## 2021-10-15 DIAGNOSIS — H5022 Vertical strabismus, left eye: Secondary | ICD-10-CM | POA: Diagnosis not present

## 2021-10-15 DIAGNOSIS — H50111 Monocular exotropia, right eye: Secondary | ICD-10-CM | POA: Insufficient documentation

## 2021-10-27 ENCOUNTER — Telehealth: Payer: Self-pay | Admitting: Acute Care

## 2021-10-27 NOTE — Telephone Encounter (Signed)
Left detailed message for pt to call back to schedule f/u lung screening CT.

## 2021-11-08 ENCOUNTER — Other Ambulatory Visit (HOSPITAL_COMMUNITY): Payer: Self-pay

## 2021-11-23 LAB — HM COLONOSCOPY

## 2021-12-28 DIAGNOSIS — Z961 Presence of intraocular lens: Secondary | ICD-10-CM | POA: Diagnosis not present

## 2021-12-28 DIAGNOSIS — Z9889 Other specified postprocedural states: Secondary | ICD-10-CM | POA: Diagnosis not present

## 2021-12-28 DIAGNOSIS — H532 Diplopia: Secondary | ICD-10-CM | POA: Diagnosis not present

## 2021-12-28 DIAGNOSIS — H5022 Vertical strabismus, left eye: Secondary | ICD-10-CM | POA: Diagnosis not present

## 2021-12-28 DIAGNOSIS — H50111 Monocular exotropia, right eye: Secondary | ICD-10-CM | POA: Diagnosis not present

## 2022-01-10 ENCOUNTER — Ambulatory Visit (INDEPENDENT_AMBULATORY_CARE_PROVIDER_SITE_OTHER): Payer: Medicare Other | Admitting: Family Medicine

## 2022-01-10 ENCOUNTER — Encounter: Payer: Self-pay | Admitting: Family Medicine

## 2022-01-10 VITALS — BP 158/94 | HR 74 | Temp 99.0°F | Resp 16 | Ht 65.0 in | Wt 125.2 lb

## 2022-01-10 DIAGNOSIS — J309 Allergic rhinitis, unspecified: Secondary | ICD-10-CM | POA: Insufficient documentation

## 2022-01-10 DIAGNOSIS — D126 Benign neoplasm of colon, unspecified: Secondary | ICD-10-CM | POA: Insufficient documentation

## 2022-01-10 DIAGNOSIS — E785 Hyperlipidemia, unspecified: Secondary | ICD-10-CM

## 2022-01-10 DIAGNOSIS — F17210 Nicotine dependence, cigarettes, uncomplicated: Secondary | ICD-10-CM

## 2022-01-10 DIAGNOSIS — Z0000 Encounter for general adult medical examination without abnormal findings: Secondary | ICD-10-CM

## 2022-01-10 DIAGNOSIS — I10 Essential (primary) hypertension: Secondary | ICD-10-CM

## 2022-01-10 DIAGNOSIS — Z125 Encounter for screening for malignant neoplasm of prostate: Secondary | ICD-10-CM | POA: Diagnosis not present

## 2022-01-10 DIAGNOSIS — F4312 Post-traumatic stress disorder, chronic: Secondary | ICD-10-CM | POA: Insufficient documentation

## 2022-01-10 DIAGNOSIS — Z86010 Personal history of colonic polyps: Secondary | ICD-10-CM

## 2022-01-10 DIAGNOSIS — F1010 Alcohol abuse, uncomplicated: Secondary | ICD-10-CM | POA: Insufficient documentation

## 2022-01-10 NOTE — Progress Notes (Signed)
? ? ? ?Complete Physical Exam  ? ?  ?I,April Miller,acting as a scribe for Lelon Huh, MD.,have documented all relevant documentation on the behalf of Lelon Huh, MD,as directed by  Lelon Huh, MD while in the presence of Lelon Huh, MD. ? ? ?Patient: Brad Myers, Male    DOB: [birth year], 76 y.o.   MRN: [id number] ?Visit Date: 01/10/2022 ? ?Today's Provider: Lelon Huh, MD  ? ?Chief Complaint  ?Patient presents with  ? Medicare Wellness  ? ?Subjective  ?  ?Brad Myers is a 76 y.o. male who presents today for his complete physical examination ? ?He reports consuming a general and low sodium diet. Home exercise routine includes walking and golf. He generally feels well. He reports sleeping well. He does not have additional problems to discuss today.  ? ?HPI ?Hypertension, follow-up ? ?BP Readings from Last 3 Encounters:  ?01/10/22 (!) 158/94  ?07/05/21 136/70  ?06/28/21 129/65  ? Wt Readings from Last 3 Encounters:  ?01/10/22 125 lb 3.2 oz (56.8 kg)  ?07/05/21 119 lb (54 kg)  ?06/28/21 121 lb (54.9 kg)  ?  ? ?He was last seen for hypertension 5 months ago.  ?BP at that visit was 135/82. Management since that visit includes continue same medication. ? ?He reports good compliance with treatment. ?He is not having side effects. none ?He is following a Regular, Low Sodium diet. ?He is exercising. ?He does does smoke. ? ?Use of agents associated with hypertension: NSAIDS.  ? ?Outside blood pressures are checks a couple times a week. ? ?He does report he has had recent follow up at [facility] where he hypertension medications are being adjusted and has follow up there in the next few months.  ? ?Pertinent labs ?Lab Results  ?Component Value Date  ? CHOL 156 05/07/2020  ? HDL 57 05/07/2020  ? [place] 76 05/07/2020  ? TRIG 113 05/07/2020  ? CHOLHDL 2.8 07/02/2019  ? Lab Results  ?Component Value Date  ? NA 135 06/08/2021  ? K 3.6 06/08/2021  ? CREATININE 0.94 06/08/2021  ? GFRNONAA >60 06/08/2021  ? GLUCOSE 92  06/08/2021  ? TSH 1.66 02/12/2012  ?  ? ?The 10-year ASCVD risk score (Arnett DK, et al., 2019) is: 35.4%* (Cholesterol units were assumed) ? ?---------------------------------------------------------------------------------------------------  ? ?Lipid/Cholesterol, Follow-up ? ?Last lipid panel Other pertinent labs  ?Lab Results  ?Component Value Date  ? CHOL 156 05/07/2020  ? HDL 57 05/07/2020  ? [place] 76 05/07/2020  ? TRIG 113 05/07/2020  ? CHOLHDL 2.8 07/02/2019  ? Lab Results  ?Component Value Date  ? ALT 26 05/07/2020  ? AST 12 (A) 05/07/2020  ? PLT 165 06/08/2021  ? TSH 1.66 02/12/2012  ?  ? ?He was last seen for this 5 months ago.  ?Management since that visit includes continue same medication. ? ?He reports good compliance with treatment. ?He is not having side effects. none ? ?Current diet: well balanced ?Current exercise: walking and golf ? ?The 10-year ASCVD risk score (Arnett DK, et al., 2019) is: 35.4%* (Cholesterol units were assumed) ? ?---------------------------------------------------------------------------------------------------  ? ? ?Medications: ?Outpatient Medications Prior to Visit  ?Medication Sig  ? aspirin EC 325 MG EC tablet Take 1 tablet (325 mg total) by mouth daily.  ? atorvastatin (LIPITOR) 40 MG tablet TAKE 1 TABLET(40 MG) BY MOUTH DAILY  ? Cholecalciferol (VITAMIN D3) 1000 units CAPS Take 1,000 Units by mouth every other day.  ? COVID-19 mRNA bivalent vaccine, Pfizer, (PFIZER COVID-19 VAC BIVALENT) injection Inject  into the muscle.  ? losartan (COZAAR) 50 MG tablet TAKE ONE-HALF TABLET BY MOUTH DAILY FOR HYPERTENSION  ? Potassium 99 MG TABS Take 99 mg by mouth every other day.  ? PROLENSA 0.07 % SOLN Apply 1 drop to eye 2 (two) times daily.  ? traZODone (DESYREL) 50 MG tablet TAKE 1 TABLET BY MOUTH EVERY NIGHT AT BEDTIME  ? triamterene-hydrochlorothiazide (MAXZIDE-25) 37.5-25 MG tablet TAKE 1 TABLET BY MOUTH DAILY FOR HYPERTENSION  ? [DISCONTINUED] triamterene-hydrochlorothiazide  (MAXZIDE) 75-50 MG tablet Take 1 tablet by mouth daily.  ? ?No facility-administered medications prior to visit.  ?  ?No Known Allergies ? ?Patient Care Team: ?[name], MD as PCP - General (Family Medicine) ?Pa, [place] [facility]) ?Estill Cotta, MD (Ophthalmology) ?System, Provider Not In (Dermatology) ? ?Review of Systems  ?Eyes:  Positive for photophobia.  ?All other systems reviewed and are negative. ? ? ?  ? Objective  ?  ?Vitals: BP (!) 158/94 (BP Location: Right Arm, Patient Position: Sitting, Cuff Size: Normal)   Pulse 74   Temp 99 ?F (37.2 ?C) (Temporal)   Resp 16   Ht '5\' 5"'$  (1.651 m)   Wt 125 lb 3.2 oz (56.8 kg)   SpO2 99%   BMI 20.83 kg/m?  ? ? ? ?Physical Exam ? ?General Appearance:    Well developed, well nourished male. Alert, cooperative, in no acute distress, appears stated age  ?Head:    Normocephalic, without obvious abnormality, atraumatic  ?Eyes:    PERRL, conjunctiva/corneas clear, EOM's intact, fundi  ?  benign, both eyes       ?Ears:    Normal TM's and external ear canals, both ears  ?Nose:   Nares normal, septum midline, mucosa normal, no drainage ?  or sinus tenderness  ?Throat:   Lips, mucosa, and tongue normal; teeth and gums normal  ?Neck:   Supple, symmetrical, trachea midline, no adenopathy;     ?  thyroid:  No enlargement/tenderness/nodules; no carotid ?  bruit or JVD  ?Back:     Symmetric, no curvature, ROM normal, no CVA tenderness  ?Lungs:     Clear to auscultation bilaterally, respirations unlabored  ?Chest wall:    No tenderness or deformity  ?Heart:    Normal heart rate. Normal rhythm. No murmurs, rubs, or gallops.  S1 and S2 normal  ?Abdomen:     Soft, non-tender, bowel sounds active all four quadrants,  ?  no masses, no organomegaly  ?Genitalia:    deferred  ?Rectal:    deferred  ?Extremities:   All extremities are intact. No cyanosis or edema  ?Pulses:   2+ and symmetric all extremities  ?Skin:   Skin color, texture, turgor normal, no rashes or  lesions  ?Lymph nodes:   Cervical, supraclavicular, and axillary nodes normal  ?Neurologic:   CNII-XII intact. Normal strength, sensation and reflexes    ?  throughout  ?  ?Most recent functional status assessment: ? ?  01/10/2022  ?  9:13 AM  ?In your present state of health, do you have any difficulty performing the following activities:  ?Hearing? 0  ?Vision? 1  ?Difficulty concentrating or making decisions? 0  ?Walking or climbing stairs? 0  ?Dressing or bathing? 0  ?Doing errands, shopping? 0  ? ?Most recent fall risk assessment: ? ?  01/10/2022  ?  9:13 AM  ?Fall Risk   ?Falls in the past year? 0  ?Number falls in past yr: 0  ?Injury with Fall? 0  ?Risk  for fall due to : No Fall Risks  ?Follow up Falls evaluation completed  ? ? Most recent depression screenings: ? ?  01/10/2022  ?  9:13 AM 01/04/2021  ? 10:31 AM  ?PHQ 2/9 Scores  ?PHQ - 2 Score 0 0  ?PHQ- 9 Score 0   ? ?Most recent cognitive screening: ? ?  11/29/2016  ?  1:21 PM  ?6CIT Screen  ?What Year? 0 points  ?What month? 0 points  ?What time? 0 points  ?Count back from 20 0 points  ?Months in reverse 0 points  ?Repeat phrase 0 points  ?Total Score 0 points  ? ?Most recent Audit-C alcohol use screening ? ?  01/10/2022  ?  9:13 AM  ?Alcohol Use Disorder Test (AUDIT)  ?1. How often do you have a drink containing alcohol? 2  ?2. How many drinks containing alcohol do you have on a typical day when you are drinking? 1  ?3. How often do you have six or more drinks on one occasion? 1  ?AUDIT-C Score 4  ?4. How often during the last year have you found that you were not able to stop drinking once you had started? 0  ?5. How often during the last year have you failed to do what was normally expected from you because of drinking? 0  ?6. How often during the last year have you needed a first drink in the morning to get yourself going after a heavy drinking session? 0  ?7. How often during the last year have you had a feeling of guilt of remorse after drinking? 0  ?8. How  often during the last year have you been unable to remember what happened the night before because you had been drinking? 0  ?9. Have you or someone else been injured as a result of your drinking? 0  ?10. Has a rel

## 2022-01-10 NOTE — Patient Instructions (Signed)
Please review the attached list of medications and notify my office if there are any errors.  ? ?Limit alcohol to no more the 2 servings per day ?

## 2022-01-11 LAB — COMPREHENSIVE METABOLIC PANEL
ALT: 37 IU/L (ref 0–44)
AST: 27 IU/L (ref 0–40)
Albumin/Globulin Ratio: 1.8 (ref 1.2–2.2)
Albumin: 4.6 g/dL (ref 3.7–4.7)
Alkaline Phosphatase: 66 IU/L (ref 44–121)
BUN/Creatinine Ratio: 13 (ref 10–24)
BUN: 13 mg/dL (ref 8–27)
Bilirubin Total: 0.3 mg/dL (ref 0.0–1.2)
CO2: 27 mmol/L (ref 20–29)
Calcium: 10.2 mg/dL (ref 8.6–10.2)
Chloride: 97 mmol/L (ref 96–106)
Creatinine, Ser: 1.02 mg/dL (ref 0.76–1.27)
Globulin, Total: 2.6 g/dL (ref 1.5–4.5)
Glucose: 82 mg/dL (ref 70–99)
Potassium: 4.2 mmol/L (ref 3.5–5.2)
Sodium: 139 mmol/L (ref 134–144)
Total Protein: 7.2 g/dL (ref 6.0–8.5)
eGFR: 77 mL/min/{1.73_m2} (ref >59)

## 2022-01-11 LAB — LIPID PANEL
Chol/HDL Ratio: 2.7 ratio (ref 0.0–5.0)
Cholesterol, Total: 147 mg/dL (ref 100–199)
HDL: 55 mg/dL (ref >39)
LDL Chol Calc (NIH): 62 mg/dL (ref 0–99)
Triglycerides: 183 mg/dL — ABNORMAL HIGH (ref 0–149)
VLDL Cholesterol Cal: 30 mg/dL (ref 5–40)

## 2022-01-11 LAB — CBC
Hematocrit: 46.5 % (ref 37.5–51.0)
Hemoglobin: 15.9 g/dL (ref 13.0–17.7)
MCH: 32.2 pg (ref 26.6–33.0)
MCHC: 34.2 g/dL (ref 31.5–35.7)
MCV: 94 fL (ref 79–97)
Platelets: 181 10*3/uL (ref 150–450)
RBC: 4.94 x10E6/uL (ref 4.14–5.80)
RDW: 12.8 % (ref 11.6–15.4)
WBC: 8.3 10*3/uL (ref 3.4–10.8)

## 2022-01-11 LAB — PSA TOTAL (REFLEX TO FREE): Prostate Specific Ag, Serum: 0.8 ng/mL (ref 0.0–4.0)

## 2022-01-19 ENCOUNTER — Encounter: Payer: Self-pay | Admitting: Family Medicine

## 2022-01-19 NOTE — Progress Notes (Signed)
? ? ? ?Annual Wellness Visit ? ?  ? ?Patient: Brad Myers, Male    DOB: Dec 03, 1945, 76 y.o.   MRN: [id number] ?Visit Date: 01/10/2022 ? ?Today's Provider: Lelon Huh, MD  ? ?Chief Complaint  ?Patient presents with  ? Medicare Wellness  ? ?Subjective  ?  ?Brad Myers is a 76 y.o. male who presents today for his Annual Wellness Visit. ? ? ?Medications: ?Outpatient Medications Prior to Visit  ?Medication Sig  ? aspirin EC 325 MG EC tablet Take 1 tablet (325 mg total) by mouth daily.  ? atorvastatin (LIPITOR) 40 MG tablet TAKE 1 TABLET(40 MG) BY MOUTH DAILY  ? Cholecalciferol (VITAMIN D3) 1000 units CAPS Take 1,000 Units by mouth every other day.  ? losartan (COZAAR) 50 MG tablet TAKE ONE-HALF TABLET BY MOUTH DAILY FOR HYPERTENSION  ? Potassium 99 MG TABS Take 99 mg by mouth every other day.  ? PROLENSA 0.07 % SOLN Apply 1 drop to eye 2 (two) times daily.  ? traZODone (DESYREL) 50 MG tablet TAKE 1 TABLET BY MOUTH EVERY NIGHT AT BEDTIME  ? triamterene-hydrochlorothiazide (MAXZIDE-25) 37.5-25 MG tablet TAKE 1 TABLET BY MOUTH DAILY FOR HYPERTENSION  ? [DISCONTINUED] COVID-19 mRNA bivalent vaccine, Pfizer, (PFIZER COVID-19 VAC BIVALENT) injection Inject into the muscle.  ? [DISCONTINUED] triamterene-hydrochlorothiazide (MAXZIDE) 75-50 MG tablet Take 1 tablet by mouth daily.  ? ?No facility-administered medications prior to visit.  ?  ?No Known Allergies ? ?Patient Care Team: ?[name], MD as PCP - General (Family Medicine) ?Pa, [place] [facility]) ?Estill Cotta, MD (Ophthalmology) ?System, Provider Not In (Dermatology) ? ? ?  ? Objective  ?  ? ? ?Most recent functional status assessment: ? ?  01/10/2022  ?  9:13 AM  ?In your present state of health, do you have any difficulty performing the following activities:  ?Hearing? 0  ?Vision? 1  ?Difficulty concentrating or making decisions? 0  ?Walking or climbing stairs? 0  ?Dressing or bathing? 0  ?Doing errands, shopping? 0  ? ?Most recent fall risk  assessment: ? ?  01/10/2022  ?  9:13 AM  ?Fall Risk   ?Falls in the past year? 0  ?Number falls in past yr: 0  ?Injury with Fall? 0  ?Risk for fall due to : No Fall Risks  ?Follow up Falls evaluation completed  ? ? Most recent depression screenings: ? ?  01/10/2022  ?  9:13 AM 01/04/2021  ? 10:31 AM  ?PHQ 2/9 Scores  ?PHQ - 2 Score 0 0  ?PHQ- 9 Score 0   ? ?Most recent cognitive screening: ? ?  11/29/2016  ?  1:21 PM  ?6CIT Screen  ?What Year? 0 points  ?What month? 0 points  ?What time? 0 points  ?Count back from 20 0 points  ?Months in reverse 0 points  ?Repeat phrase 0 points  ?Total Score 0 points  ? ?Most recent Audit-C alcohol use screening ? ?  01/10/2022  ?  9:13 AM  ?Alcohol Use Disorder Test (AUDIT)  ?1. How often do you have a drink containing alcohol? 2  ?2. How many drinks containing alcohol do you have on a typical day when you are drinking? 1  ?3. How often do you have six or more drinks on one occasion? 1  ?AUDIT-C Score 4  ?4. How often during the last year have you found that you were not able to stop drinking once you had started? 0  ?5. How often during the last year  have you failed to do what was normally expected from you because of drinking? 0  ?6. How often during the last year have you needed a first drink in the morning to get yourself going after a heavy drinking session? 0  ?7. How often during the last year have you had a feeling of guilt of remorse after drinking? 0  ?8. How often during the last year have you been unable to remember what happened the night before because you had been drinking? 0  ?9. Have you or someone else been injured as a result of your drinking? 0  ?10. Has a relative or friend or a doctor or another health worker been concerned about your drinking or suggested you cut down? 0  ?Alcohol Use Disorder Identification Test Final Score (AUDIT) 4  ? ?A score of 3 or more in women, and 4 or more in men indicates increased risk for alcohol abuse, EXCEPT if all of the points are  from question 1  ? ? ? Assessment & Plan  ?  ? ?Annual wellness visit done today including the all of the following: ?Reviewed patient's Family Medical History ?Reviewed and updated list of patient's medical providers ?Assessment of cognitive impairment was done ?Assessed patient's functional ability ?Established a written schedule for health screening services ?Health Risk Assessent Completed and Reviewed ? ?Exercise Activities and Dietary recommendations ? Goals   ? ?  DIET - INCREASE WATER INTAKE   ?  Recommend increasing water intake to 2-4 glasses a day (currently drinking none). ?  ?  Quit Smoking   ?  Recommend to continue efforts to reduce smoking habits until no longer smoking. ? ?  ? ?  ? ? ?Immunization History  ?Administered Date(s) Administered  ? Fluad Quad(high Dose 65+) 07/02/2019, 07/20/2020, 06/28/2021  ? Influenza, High Dose Seasonal PF 06/26/2015, 06/15/2018  ? Influenza,inj,Quad PF,6+ Mos 07/24/2017  ? PFIZER(Purple Top)SARS-COV-2 Vaccination 10/20/2019, 12/11/2019, 01/01/2020, 09/10/2020, 11/29/2020  ? Pension scheme manager 75yr & up 09/07/2021  ? Pneumococcal Conjugate-13 07/18/2014  ? Pneumococcal Polysaccharide-23 01/23/2012  ? Tdap 01/23/2012  ? Zoster Recombinat (Shingrix) 05/28/2020, 07/30/2020  ? Zoster, Live 05/18/2012  ? ? ?Health Maintenance  ?Topic Date Due  ? TETANUS/TDAP  01/22/2022  ? INFLUENZA VACCINE  05/10/2022  ? COLONOSCOPY (Pts 45-448yrInsurance coverage will need to be confirmed)  10/29/2022  ? Pneumonia Vaccine 6542Years old  Completed  ? COVID-19 Vaccine  Completed  ? Hepatitis C Screening  Completed  ? Zoster Vaccines- Shingrix  Completed  ? HPV VACCINES  Aged Out  ? ? ? ?Discussed health benefits of physical activity, and encouraged him to engage in regular exercise appropriate for his age and condition.  ?  ?  ? ? ? ? ?DoLelon HuhMD  ?[phone]) ?[phone]) ? ?[facility]   ?

## 2022-02-17 DIAGNOSIS — H5022 Vertical strabismus, left eye: Secondary | ICD-10-CM | POA: Diagnosis not present

## 2022-02-17 DIAGNOSIS — H532 Diplopia: Secondary | ICD-10-CM | POA: Diagnosis not present

## 2022-02-17 DIAGNOSIS — H50111 Monocular exotropia, right eye: Secondary | ICD-10-CM | POA: Diagnosis not present

## 2022-02-17 DIAGNOSIS — Z9889 Other specified postprocedural states: Secondary | ICD-10-CM | POA: Diagnosis not present

## 2022-02-18 ENCOUNTER — Other Ambulatory Visit: Payer: Self-pay

## 2022-02-18 DIAGNOSIS — Z87891 Personal history of nicotine dependence: Secondary | ICD-10-CM

## 2022-02-18 DIAGNOSIS — Z122 Encounter for screening for malignant neoplasm of respiratory organs: Secondary | ICD-10-CM

## 2022-02-18 DIAGNOSIS — F17210 Nicotine dependence, cigarettes, uncomplicated: Secondary | ICD-10-CM

## 2022-02-21 DIAGNOSIS — H50111 Monocular exotropia, right eye: Secondary | ICD-10-CM | POA: Diagnosis not present

## 2022-02-21 DIAGNOSIS — H532 Diplopia: Secondary | ICD-10-CM | POA: Diagnosis not present

## 2022-02-21 DIAGNOSIS — H5022 Vertical strabismus, left eye: Secondary | ICD-10-CM | POA: Diagnosis not present

## 2022-02-21 DIAGNOSIS — H5333 Simultaneous visual perception without fusion: Secondary | ICD-10-CM | POA: Diagnosis not present

## 2022-02-21 DIAGNOSIS — Z9889 Other specified postprocedural states: Secondary | ICD-10-CM | POA: Diagnosis not present

## 2022-02-24 ENCOUNTER — Ambulatory Visit
Admission: RE | Admit: 2022-02-24 | Discharge: 2022-02-24 | Disposition: A | Discharge status: Home / Self Care | Payer: Medicare Other | Source: Ambulatory Visit | Attending: Acute Care | Admitting: Acute Care

## 2022-02-24 DIAGNOSIS — Z122 Encounter for screening for malignant neoplasm of respiratory organs: Secondary | ICD-10-CM | POA: Insufficient documentation

## 2022-02-24 DIAGNOSIS — Z87891 Personal history of nicotine dependence: Secondary | ICD-10-CM | POA: Diagnosis not present

## 2022-02-24 DIAGNOSIS — F17210 Nicotine dependence, cigarettes, uncomplicated: Secondary | ICD-10-CM | POA: Insufficient documentation

## 2022-02-28 ENCOUNTER — Other Ambulatory Visit: Payer: Self-pay

## 2022-02-28 DIAGNOSIS — F17210 Nicotine dependence, cigarettes, uncomplicated: Secondary | ICD-10-CM

## 2022-02-28 DIAGNOSIS — Z122 Encounter for screening for malignant neoplasm of respiratory organs: Secondary | ICD-10-CM

## 2022-02-28 DIAGNOSIS — Z87891 Personal history of nicotine dependence: Secondary | ICD-10-CM

## 2022-03-04 DIAGNOSIS — H532 Diplopia: Secondary | ICD-10-CM | POA: Diagnosis not present

## 2022-03-04 DIAGNOSIS — Z9841 Cataract extraction status, right eye: Secondary | ICD-10-CM | POA: Diagnosis not present

## 2022-03-04 DIAGNOSIS — Z9842 Cataract extraction status, left eye: Secondary | ICD-10-CM | POA: Diagnosis not present

## 2022-03-04 DIAGNOSIS — H52213 Irregular astigmatism, bilateral: Secondary | ICD-10-CM | POA: Diagnosis not present

## 2022-05-16 ENCOUNTER — Encounter: Payer: Self-pay | Admitting: Family Medicine

## 2022-05-16 ENCOUNTER — Ambulatory Visit (INDEPENDENT_AMBULATORY_CARE_PROVIDER_SITE_OTHER): Payer: Medicare Other | Admitting: Family Medicine

## 2022-05-16 DIAGNOSIS — G4700 Insomnia, unspecified: Secondary | ICD-10-CM | POA: Diagnosis not present

## 2022-05-16 DIAGNOSIS — I10 Essential (primary) hypertension: Secondary | ICD-10-CM | POA: Diagnosis not present

## 2022-05-16 MED ORDER — LOSARTAN POTASSIUM 50 MG PO TABS: 50.0000 mg | ORAL_TABLET | Freq: Every day | ORAL | 3 refills | Status: AC

## 2022-05-16 MED ORDER — TRAZODONE HCL 100 MG PO TABS: 50.0000 mg | ORAL_TABLET | Freq: Every day | ORAL | 3 refills | Status: AC

## 2022-05-16 NOTE — Progress Notes (Signed)
I,Jana Robinson,acting as a scribe for Lelon Huh, MD.,have documented all relevant documentation on the behalf of Lelon Huh, MD,as directed by  Lelon Huh, MD while in the presence of Lelon Huh, MD.   Established patient visit   Patient: Brad Myers   DOB: 08-17-1946   76 y.o. Male  MRN: [id number] Visit Date: 05/16/2022  Today's healthcare provider: Lelon Huh, MD   Chief Complaint  Patient presents with   Hypertension   Subjective    HPI  Hypertension, follow-up  BP Readings from Last 3 Encounters:  05/16/22 (!) 151/70  01/10/22 (!) 158/94  07/05/21 136/70   Wt Readings from Last 3 Encounters:  05/16/22 126 lb (57.2 kg)  01/10/22 125 lb 3.2 oz (56.8 kg)  07/05/21 119 lb (54 kg)     He was last seen for hypertension 4 months ago.  BP at that visit was 158/94. Management since that visit includes no changes.  He reports excellent compliance with treatment. He is not having side effects.  He is following a Regular diet. He is exercising. He does smoke.  Use of agents associated with hypertension: none.   Outside blood pressures are 140/s/70s Symptoms: No chest pain No chest pressure  No palpitations No syncope  No dyspnea No orthopnea  No paroxysmal nocturnal dyspnea No lower extremity edema   Pertinent labs Lab Results  Component Value Date   CHOL 147 01/10/2022   HDL 55 01/10/2022   LDLCALC 62 01/10/2022   TRIG 183 (H) 01/10/2022   CHOLHDL 2.7 01/10/2022   Lab Results  Component Value Date   NA 139 01/10/2022   K 4.2 01/10/2022   CREATININE 1.02 01/10/2022   EGFR 77 01/10/2022   GLUCOSE 82 01/10/2022   TSH 1.66 02/12/2012     The 10-year ASCVD risk score (Arnett DK, et al., 2019) is: 34.6%  -  Medications: Outpatient Medications Prior to Visit  Medication Sig   aspirin EC 325 MG EC tablet Take 1 tablet (325 mg total) by mouth daily.   atorvastatin (LIPITOR) 40 MG tablet TAKE 1 TABLET(40 MG) BY MOUTH DAILY    Cholecalciferol (VITAMIN D3) 1000 units CAPS Take 1,000 Units by mouth every other day.   losartan (COZAAR) 50 MG tablet TAKE ONE-HALF TABLET BY MOUTH DAILY FOR HYPERTENSION   Potassium 99 MG TABS Take 99 mg by mouth every other day.   PROLENSA 0.07 % SOLN Apply 1 drop to eye 2 (two) times daily.   traZODone (DESYREL) 50 MG tablet TAKE 1 TABLET BY MOUTH EVERY NIGHT AT BEDTIME   triamterene-hydrochlorothiazide (MAXZIDE-25) 37.5-25 MG tablet TAKE 1 TABLET BY MOUTH DAILY FOR HYPERTENSION   No facility-administered medications prior to visit.    Review of Systems  Constitutional:  Negative for appetite change, chills and fever.  Respiratory:  Negative for chest tightness, shortness of breath and wheezing.   Cardiovascular:  Negative for chest pain and palpitations.  Gastrointestinal:  Negative for abdominal pain, nausea and vomiting.       Objective    BP 138/70   Pulse 60   Temp 98.3 F (36.8 C) (Oral)   Resp 16   Wt 126 lb (57.2 kg)   SpO2 99%   BMI 20.97 kg/m    Physical Exam   General: Appearance:    Well developed, well nourished male in no acute distress  Eyes:    PERRL, conjunctiva/corneas clear, EOM's intact       Lungs:     Clear to auscultation  bilaterally, respirations unlabored  Heart:    Normal heart rate. Normal rhythm. No murmurs, rubs, or gallops.    MS:   All extremities are intact.    Neurologic:   Awake, alert, oriented x 3. No apparent focal neurological defect.         Assessment & Plan     1. Essential (primary) hypertension Borderline controlled. Increase losartan (COZAAR) 50 MG tablet; to 1 fulle tablet (50 mg total) by mouth daily.  Dispense: 90 tablet; Refill: 3  2. Insomnia, unspecified type He doesn't feel the 45mtrazodone is working as well as it used. To. Will change to  traZODone (DESYREL) 100 MG tablet; Take 0.5-1 tablets (50-100 mg total) by mouth at bedtime.  Dispense: 90 tablet; Refill: 3      The entirety of the information  documented in the History of Present Illness, Review of Systems and Physical Exam were personally obtained by me. Portions of this information were initially documented by the CMA and reviewed by me for thoroughness and accuracy.     DLelon Huh MD  [phone](phone) [phone](fax)  [place]

## 2022-05-16 NOTE — Patient Instructions (Signed)
.   Please review the attached list of medications and notify my office if there are any errors.   . Please bring all of your medications to every appointment so we can make sure that our medication list is the same as yours.   

## 2022-06-21 ENCOUNTER — Ambulatory Visit (INDEPENDENT_AMBULATORY_CARE_PROVIDER_SITE_OTHER): Payer: Medicare Other

## 2022-06-21 DIAGNOSIS — Z23 Encounter for immunization: Secondary | ICD-10-CM | POA: Diagnosis not present

## 2022-08-17 ENCOUNTER — Encounter: Payer: Self-pay | Admitting: Family Medicine

## 2022-08-17 ENCOUNTER — Ambulatory Visit (INDEPENDENT_AMBULATORY_CARE_PROVIDER_SITE_OTHER): Payer: Medicare Other | Admitting: Family Medicine

## 2022-08-17 VITALS — BP 134/72 | HR 72 | Resp 16 | Ht 65.0 in | Wt 126.0 lb

## 2022-08-17 DIAGNOSIS — I10 Essential (primary) hypertension: Secondary | ICD-10-CM | POA: Diagnosis not present

## 2022-08-17 DIAGNOSIS — G4700 Insomnia, unspecified: Secondary | ICD-10-CM

## 2022-08-17 DIAGNOSIS — L819 Disorder of pigmentation, unspecified: Secondary | ICD-10-CM

## 2022-08-17 NOTE — Progress Notes (Signed)
I,Tiffany J Bragg,acting as a scribe for Lelon Huh, MD.,have documented all relevant documentation on the behalf of Lelon Huh, MD,as directed by  Lelon Huh, MD while in the presence of Lelon Huh, MD.  Established patient visit   Patient: Brad Myers   DOB: 12/07/1945   76 y.o. Male  MRN: [id number] Visit Date: 08/17/2022  Today's healthcare provider: Lelon Huh, MD   Chief Complaint  Patient presents with   Hypertension   Insomnia   Subjective    HPI  Hypertension, follow-up  BP Readings from Last 3 Encounters:  08/17/22 134/72  05/16/22 138/70  01/10/22 (!) 158/94   Wt Readings from Last 3 Encounters:  08/17/22 126 lb (57.2 kg)  05/16/22 126 lb (57.2 kg)  01/10/22 125 lb 3.2 oz (56.8 kg)     He was last seen for hypertension 3 months ago.  BP at that visit was 138/70. Management since that visit includes increased losartan to 65m.  He reports excellent compliance with treatment. He is not having side effects.  He is following a Regular diet. He is exercising. He does smoke.  Use of agents associated with hypertension: none.   Outside blood pressures are 130/85 Symptoms: No chest pain No chest pressure  No palpitations No syncope  No dyspnea No orthopnea  No paroxysmal nocturnal dyspnea No lower extremity edema   Pertinent labs Lab Results  Component Value Date   CHOL 147 01/10/2022   HDL 55 01/10/2022   LDLCALC 62 01/10/2022   TRIG 183 (H) 01/10/2022   CHOLHDL 2.7 01/10/2022   Lab Results  Component Value Date   NA 139 01/10/2022   K 4.2 01/10/2022   CREATININE 1.02 01/10/2022   EGFR 77 01/10/2022   GLUCOSE 82 01/10/2022   TSH 1.66 02/12/2012     The 10-year ASCVD risk score (Arnett DK, et al., 2019) is: 29.1%   Follow up for insomnia  The patient was last seen for this 3 months ago. Changes made at last visit include increased dosage to 1063mtablets taking 1/2 tablet most nights. .  He reports excellent  compliance with treatment. He feels that condition is Improved. He is not having side effects.   ----------------------------------------------------------------------------------------------  He also has mole on his back that has been bothering him for several months.   Medications: Outpatient Medications Prior to Visit  Medication Sig   aspirin EC 325 MG EC tablet Take 1 tablet (325 mg total) by mouth daily.   atorvastatin (LIPITOR) 40 MG tablet TAKE 1 TABLET(40 MG) BY MOUTH DAILY   Cholecalciferol (VITAMIN D3) 1000 units CAPS Take 1,000 Units by mouth every other day.   losartan (COZAAR) 50 MG tablet Take 1 tablet (50 mg total) by mouth daily.   Potassium 99 MG TABS Take 99 mg by mouth every other day.   PROLENSA 0.07 % SOLN Apply 1 drop to eye 2 (two) times daily.   traZODone (DESYREL) 100 MG tablet Take 0.5-1 tablets (50-100 mg total) by mouth at bedtime.   triamterene-hydrochlorothiazide (MAXZIDE-25) 37.5-25 MG tablet TAKE 1 TABLET BY MOUTH DAILY FOR HYPERTENSION   No facility-administered medications prior to visit.    Review of Systems  Constitutional:  Negative for appetite change, chills and fever.  Respiratory:  Negative for chest tightness, shortness of breath and wheezing.   Cardiovascular:  Negative for chest pain and palpitations.  Gastrointestinal:  Negative for abdominal pain, nausea and vomiting.       Objective  BP 134/72 (BP Location: Right Arm, Patient Position: Sitting, Cuff Size: Normal)   Pulse 72   Resp 16   Ht _0  (1.651 m)   Wt 126 lb (57.2 kg)   SpO2 100%   BMI 20.97 kg/m    Physical Exam   General: Appearance:    Well developed, well nourished male in no acute distress  Eyes:    PERRL, conjunctiva/corneas clear, EOM's intact       Lungs:     Clear to auscultation bilaterally, respirations unlabored  Heart:    Normal heart rate. Normal rhythm. No murmurs, rubs, or gallops.    Skin:   About 1 cm crusty exophytic pigmented skin lesion mid  back   Neurologic:   Awake, alert, oriented x 3. No apparent focal neurological defect.        Assessment & Plan     1. Essential (primary) hypertension Well controlled.  Continue current medications.    2. Insomnia, unspecified type Doing well with increased dose of trazodone.   3. Pigmented skin lesion  - Ambulatory referral to Dermatology      The entirety of the information documented in the History of Present Illness, Review of Systems and Physical Exam were personally obtained by me. Portions of this information were initially documented by the CMA and reviewed by me for thoroughness and accuracy.     Lelon Huh, MD  [facility] [phone] (phone) [phone] (fax)  [place]

## 2022-09-12 IMAGING — CT CT HEAD W/O CM
3 series · 16 of 47 positions shown, 19 images · non-contrast
Comparison: None.

CLINICAL DATA: Headache neck pain.  Transient ischemic attack.

EXAM:
CT HEAD WITHOUT CONTRAST
TECHNIQUE: Contiguous axial images were obtained from the base of the skull
through the vertex without intravenous contrast.

[Series 2: head wo · axial · 0.46mm/px · z∈[-148,-18]mm · 10 of 32 slices shown, 13 images]
[im 3/32  brain]
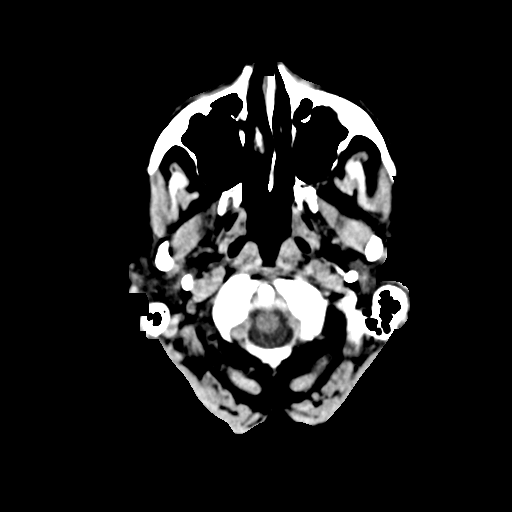
[im 3/32  bone]
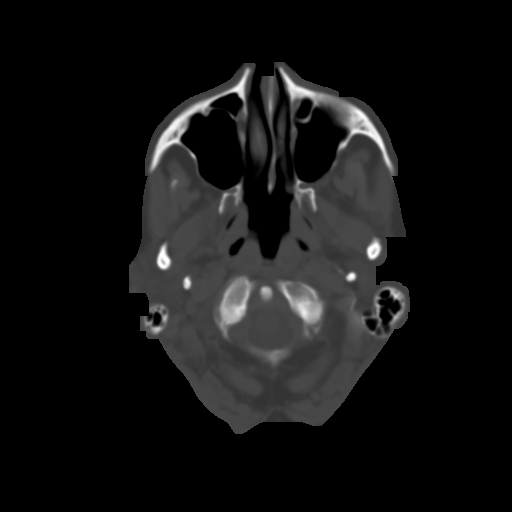
[im 6/32  brain]
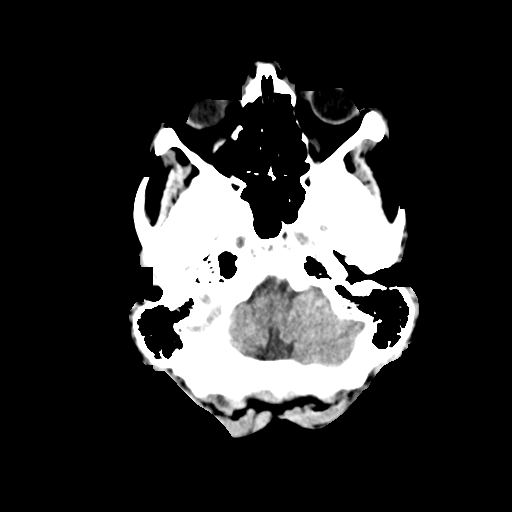
[im 9/32  brain]
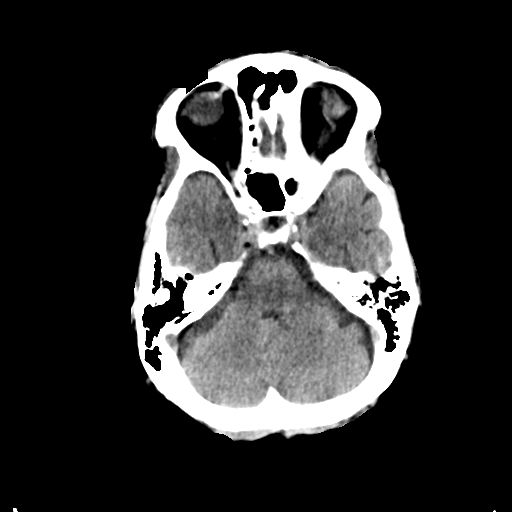
[im 11/32  brain]
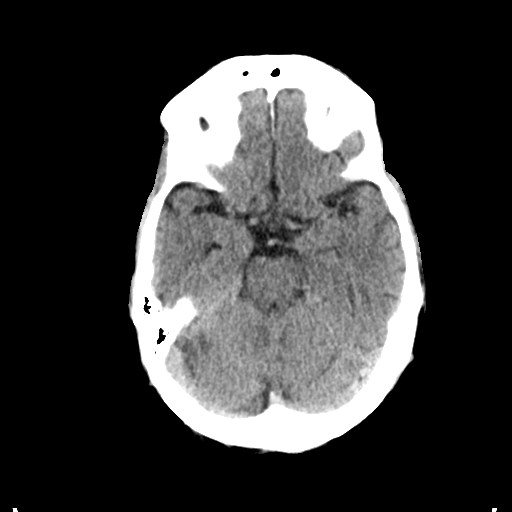
[im 14/32  brain]
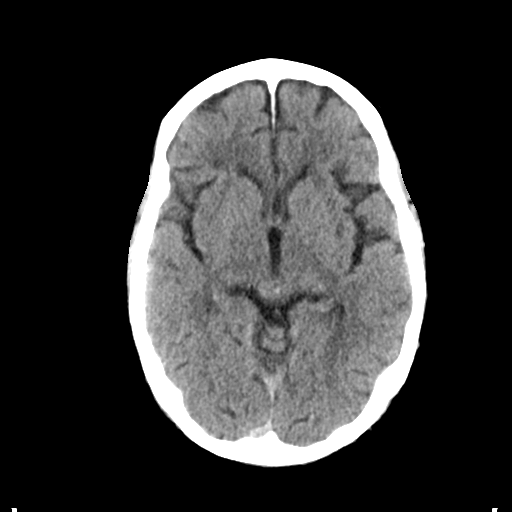
[im 14/32  bone]
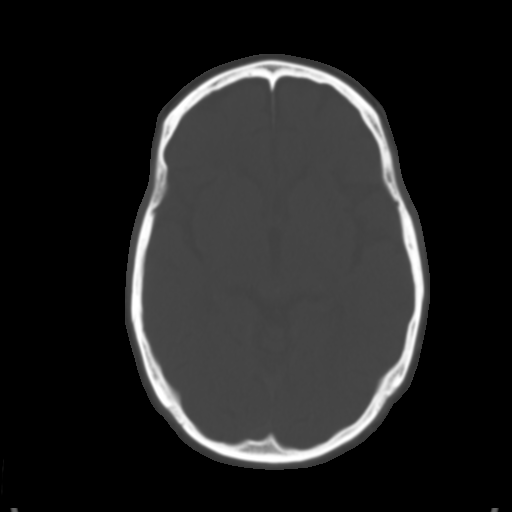
[im 18/32  brain]
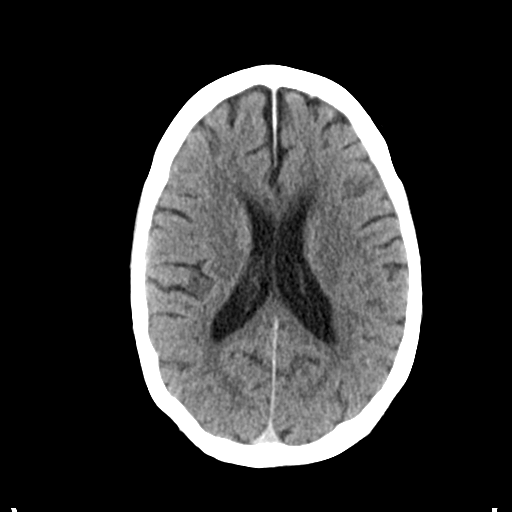
[im 21/32  brain]
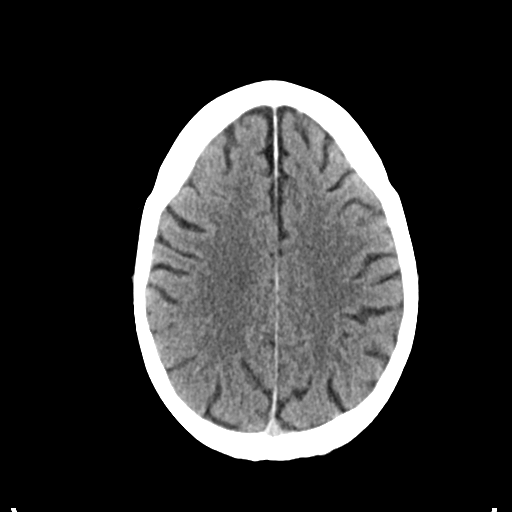
[im 24/32  brain]
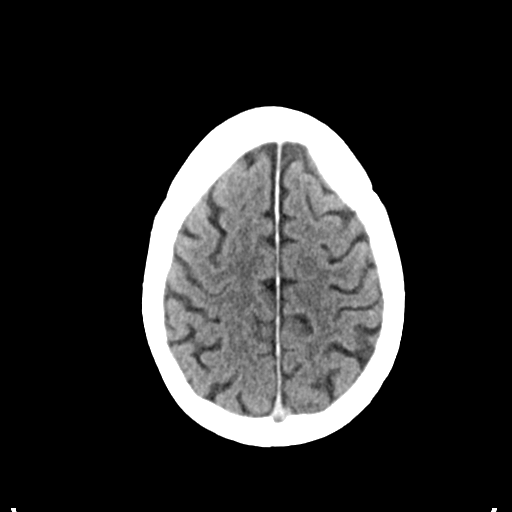
[im 26/32  brain]
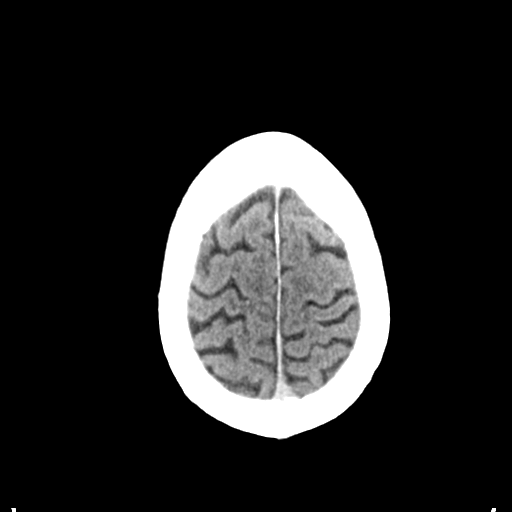
[im 26/32  bone]
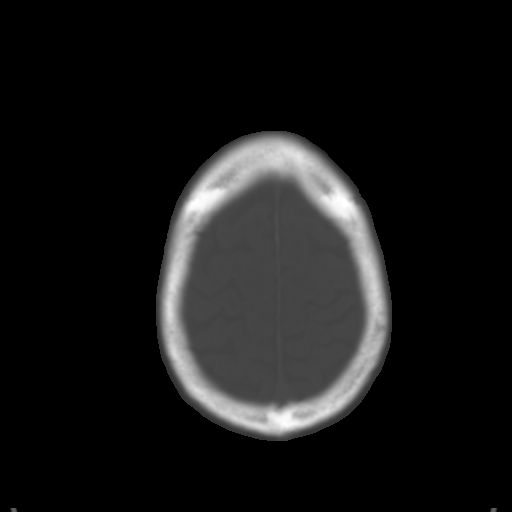
[im 29/32  brain]
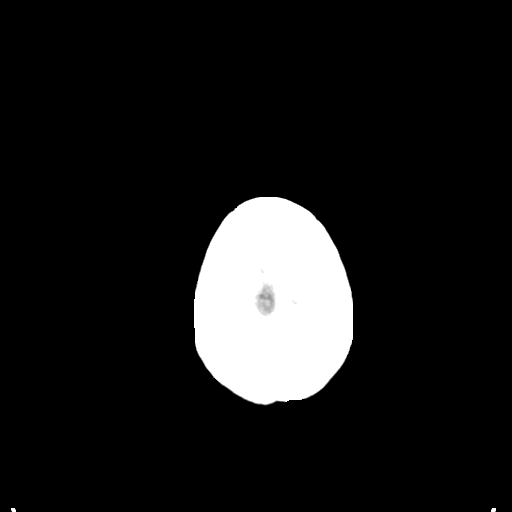

[Series 4: coronal soft tissue · coronal · 0.33mm/px · 3 of 69 slices shown]
[im 23/69  brain]
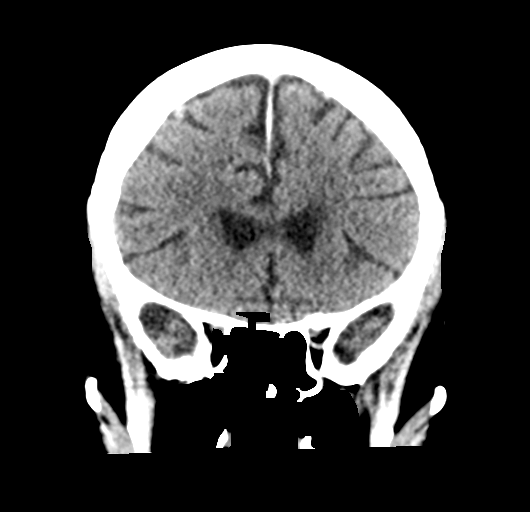
[im 31/69  brain]
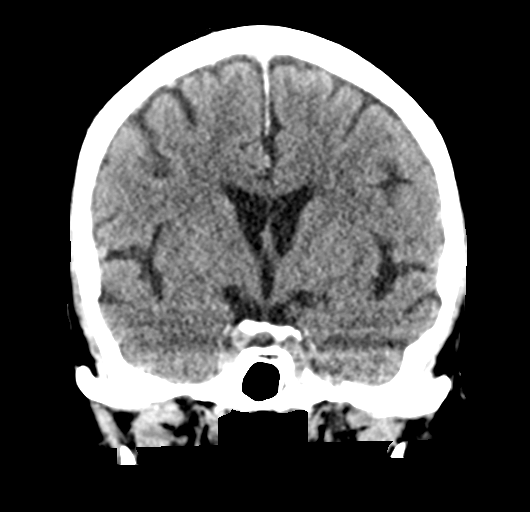
[im 38/69  brain]
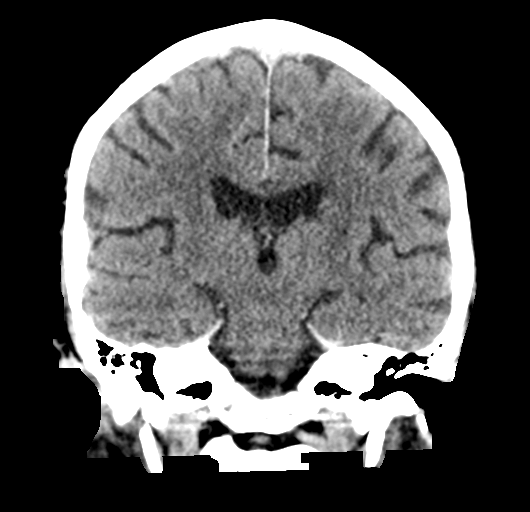

[Series 5: sagittal soft tissue · sagittal · 0.35mm/px · 3 of 58 slices shown]
[im 20/58  brain]
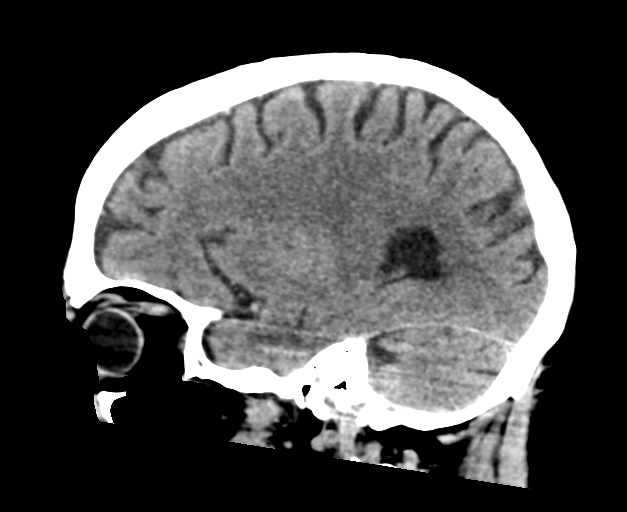
[im 29/58  brain]
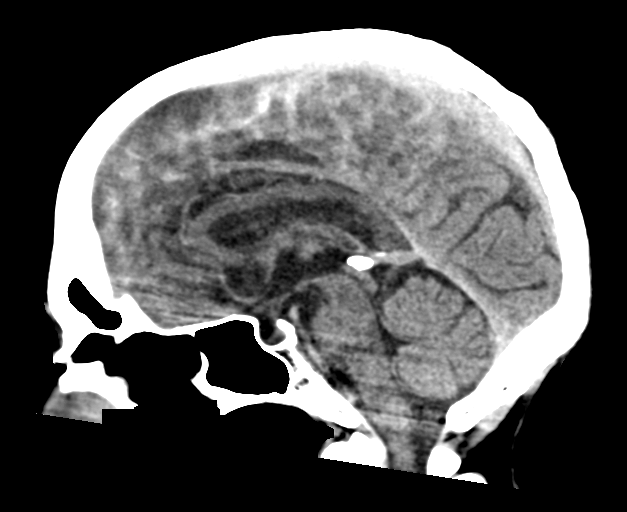
[im 39/58  brain]
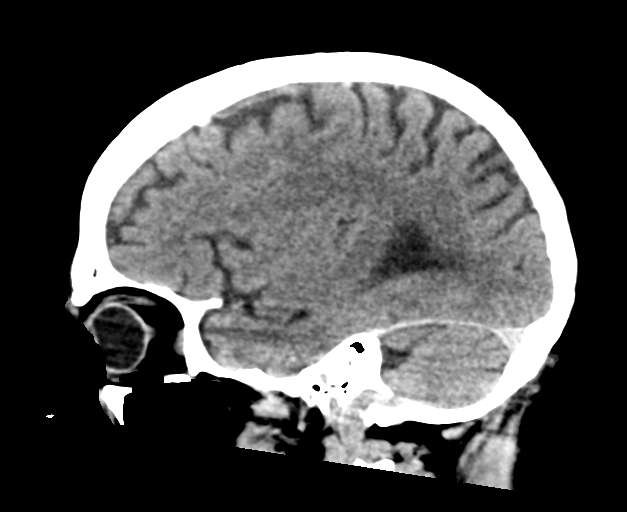

[16 of 47 positions shown; findings below may reference images not displayed]

FINDINGS: Brain: The ventricles and sulci appropriate size for patient's age.
The gray-white matter discrimination is preserved. There is no acute
intracranial hemorrhage. No mass effect or midline shift no
extra-axial fluid collection. A subcentimeter high attenuating focus
along the right frontal lobe ([DATE]) likely a small calcified
meningioma.

Vascular: No hyperdense vessel or unexpected calcification.

Skull: Normal. Negative for fracture or focal lesion.

Sinuses/Orbits: No acute finding.

Other: None
IMPRESSION: Unremarkable noncontrast CT of the brain.

## 2023-01-18 NOTE — Progress Notes (Signed)
Vivien Rota DeSanto,acting as a scribe for Mila Merry, MD.,have documented all relevant documentation on the behalf of Mila Merry, MD,as directed by  Mila Merry, MD while in the presence of Mila Merry, MD.    Complete Physical Exam      Patient: Brad Myers, Male    DOB: [birth year], 77 y.o.   MRN: [id number] Visit Date: 01/20/2023  Today's Provider: Mila Merry, MD   No chief complaint on file.  Subjective    Brad Myers is a 77 y.o. male who presents today for his complete physical examination He reports consuming a  regular  diet.  He generally feels well. He reports sleeping well. He does not have additional problems to discuss today.   [facility] is [facility], had many removed last year and is due for this years colonoscopy soon.   Medications: Outpatient Medications Prior to Visit  Medication Sig   aspirin EC 325 MG EC tablet Take 1 tablet (325 mg total) by mouth daily.   atorvastatin (LIPITOR) 40 MG tablet TAKE 1 TABLET(40 MG) BY MOUTH DAILY   Cholecalciferol (VITAMIN D3) 1000 units CAPS Take 1,000 Units by mouth every other day.   losartan (COZAAR) 50 MG tablet Take 1 tablet (50 mg total) by mouth daily.   Potassium 99 MG TABS Take 99 mg by mouth every other day.   PROLENSA 0.07 % SOLN Apply 1 drop to eye 2 (two) times daily.   traZODone (DESYREL) 100 MG tablet Take 0.5-1 tablets (50-100 mg total) by mouth at bedtime.   triamterene-hydrochlorothiazide (MAXZIDE-25) 37.5-25 MG tablet TAKE 1 TABLET BY MOUTH DAILY FOR HYPERTENSION   No facility-administered medications prior to visit.    No Known Allergies  Patient Care Team: Malva Limes, MD as PCP - General (Family Medicine) Pa, [facility] (Optometry) Dingeldein, Viviann Spare, MD (Ophthalmology) System, Provider Not In (Dermatology)  Review of Systems  Constitutional: Negative.   HENT: Negative.    Eyes: Negative.   Respiratory: Negative.    Cardiovascular:  Negative.   Gastrointestinal: Negative.   Endocrine: Negative.   Genitourinary: Negative.   Musculoskeletal: Negative.   Skin: Negative.   Allergic/Immunologic: Negative.   Neurological: Negative.   Hematological: Negative.   Psychiatric/Behavioral: Negative.          Objective    Vitals: BP 138/66 (BP Location: Right Arm, Patient Position: Sitting, Cuff Size: Normal)   Ht 5\' 5"  (1.651 m)   Wt 131 lb (59.4 kg)   SpO2 96%   BMI 21.80 kg/m    Physical Exam   General Appearance:    Well developed, well nourished male. Alert, cooperative, in no acute distress, appears stated age  Head:    Normocephalic, without obvious abnormality, atraumatic  Eyes:    PERRL, conjunctiva/corneas clear, EOM's intact, fundi    benign, both eyes       Ears:    Normal TM's and external ear canals, both ears  Nose:   Nares normal, septum midline, mucosa normal, no drainage   or sinus tenderness  Throat:   Lips, mucosa, and tongue normal; teeth and gums normal  Neck:   Supple, symmetrical, trachea midline, no adenopathy;       thyroid:  No enlargement/tenderness/nodules; no carotid   bruit or JVD  Back:     Symmetric, no curvature, ROM normal, no CVA tenderness  Lungs:     Clear to auscultation bilaterally, respirations unlabored  Chest wall:    No  tenderness or deformity  Heart:    Normal heart rate. Normal rhythm. No murmurs, rubs, or gallops.  S1 and S2 normal  Abdomen:     Soft, non-tender, bowel sounds active all four quadrants,    no masses, no organomegaly  Genitalia:    deferred  Rectal:    deferred  Extremities:   All extremities are intact. No cyanosis or edema  Pulses:   2+ and symmetric all extremities  Skin:   Skin color, texture, turgor normal, no rashes or lesions  Lymph nodes:   Cervical, supraclavicular, and axillary nodes normal  Neurologic:   CNII-XII intact. Normal strength, sensation and reflexes      throughout   EKG: Occasional PAC. Otherwise normal.   Assessment &  Plan     1. Annual physical exam  2. Essential (primary) hypertension Fairly well controlled. Continue current medications.   - CBC with Differential/Platelet - Comprehensive metabolic panel - TSH  3. Hyperlipidemia, unspecified hyperlipidemia type He is tolerating atorvastatin well with no adverse effects.   - Lipid Panel With LDL/HDL Ratio  4. Coronary artery calcification seen on CAT scan Asymptomatic. Compliant with medication.  Continue aggressive risk factor modification.  He is tolerating atorvastatin well with no adverse effects.    5. History of adenomatous polyp of colon [facility]   6. Smoking greater than 30 pack years UTD on lung cancer screening.   7. Pulmonary emphysema, unspecified emphysema type Incidental finding on LDCT. Encourage smoking cessation. Asymptomatic.   8. Aortic atherosclerosis Asymptomatic. Compliant with medication.  Continue aggressive risk factor modification.  He is tolerating atorvastatin well with no adverse effects.    9. Prostate cancer screening  - PSA Total (Reflex To Free) ([facility] only)     The entirety of the information documented in the History of Present Illness, Review of Systems and Physical Exam were personally obtained by me. Portions of this information were initially documented by the CMA and reviewed by me for thoroughness and accuracy.     Mila Merryonald Suesan Mohrmann, MD  [facility] [phone] (phone) [phone] (fax)  [facility]

## 2023-01-20 ENCOUNTER — Encounter: Payer: Self-pay | Admitting: Family Medicine

## 2023-01-20 ENCOUNTER — Ambulatory Visit (INDEPENDENT_AMBULATORY_CARE_PROVIDER_SITE_OTHER): Payer: Medicare PPO | Admitting: Family Medicine

## 2023-01-20 VITALS — BP 138/66 | Ht 65.0 in | Wt 131.0 lb

## 2023-01-20 DIAGNOSIS — E785 Hyperlipidemia, unspecified: Secondary | ICD-10-CM

## 2023-01-20 DIAGNOSIS — I700 Atherosclerosis of aorta: Secondary | ICD-10-CM

## 2023-01-20 DIAGNOSIS — F17210 Nicotine dependence, cigarettes, uncomplicated: Secondary | ICD-10-CM

## 2023-01-20 DIAGNOSIS — J439 Emphysema, unspecified: Secondary | ICD-10-CM

## 2023-01-20 DIAGNOSIS — I10 Essential (primary) hypertension: Secondary | ICD-10-CM

## 2023-01-20 DIAGNOSIS — Z0000 Encounter for general adult medical examination without abnormal findings: Secondary | ICD-10-CM

## 2023-01-20 DIAGNOSIS — I2510 Atherosclerotic heart disease of native coronary artery without angina pectoris: Secondary | ICD-10-CM

## 2023-01-20 DIAGNOSIS — Z125 Encounter for screening for malignant neoplasm of prostate: Secondary | ICD-10-CM

## 2023-01-20 DIAGNOSIS — Z86010 Personal history of colonic polyps: Secondary | ICD-10-CM

## 2023-01-20 NOTE — Progress Notes (Signed)
Annual Wellness Visit     Patient: Brad Myers, Male    DOB: Dec 30, 1945, 77 y.o.   MRN: [id number] Visit Date: 01/20/2023  Today's Provider: Mila Merry, MD   Chief Complaint  Patient presents with   Annual Exam   Hypertension   Hyperlipidemia   Subjective    Brad Myers is a 77 y.o. male who presents today for his Annual Wellness Visit.   Medications: Outpatient Medications Prior to Visit  Medication Sig   atorvastatin (LIPITOR) 40 MG tablet TAKE 1 TABLET(40 MG) BY MOUTH DAILY   Cholecalciferol (VITAMIN D3) 1000 units CAPS Take 1,000 Units by mouth every other day.   losartan (COZAAR) 50 MG tablet Take 1 tablet (50 mg total) by mouth daily.   Potassium 99 MG TABS Take 99 mg by mouth every other day.   PROLENSA 0.07 % SOLN Apply 1 drop to eye 2 (two) times daily.   traZODone (DESYREL) 100 MG tablet Take 0.5-1 tablets (50-100 mg total) by mouth at bedtime.   triamterene-hydrochlorothiazide (MAXZIDE-25) 37.5-25 MG tablet TAKE 1 TABLET BY MOUTH DAILY FOR HYPERTENSION   [DISCONTINUED] aspirin EC 325 MG EC tablet Take 1 tablet (325 mg total) by mouth daily.   No facility-administered medications prior to visit.    No Known Allergies  Patient Care Team: Malva Limes, MD as PCP - General (Family Medicine) Pa, [facility]) Dingeldein, Viviann Spare, MD (Ophthalmology) System, Provider Not In (Dermatology)    Objective      Most recent functional status assessment:    01/20/2023    9:39 AM  In your present state of health, do you have any difficulty performing the following activities:  Hearing? 0  Vision? 1  Difficulty concentrating or making decisions? 0  Walking or climbing stairs? 0  Dressing or bathing? 0  Doing errands, shopping? 0   Most recent fall risk assessment:    01/20/2023    9:39 AM  Fall Risk   Falls in the past year? 0  Number falls in past yr: 0  Injury with Fall? 0    Most recent depression screenings:    01/20/2023     9:39 AM 01/10/2022    9:13 AM  PHQ 2/9 Scores  PHQ - 2 Score 0 0  PHQ- 9 Score 0 0   Most recent cognitive screening:    11/29/2016    1:21 PM  6CIT Screen  What Year? 0 points  What month? 0 points  What time? 0 points  Count back from 20 0 points  Months in reverse 0 points  Repeat phrase 0 points  Total Score 0 points   Most recent Audit-C alcohol use screening    01/20/2023    9:37 AM  Alcohol Use Disorder Test (AUDIT)  1. How often do you have a drink containing alcohol? 4   A score of 3 or more in women, and 4 or more in men indicates increased risk for alcohol abuse, EXCEPT if all of the points are from question 1     Assessment & Plan     Annual wellness visit done today including the all of the following: Reviewed patient's Family Medical History Reviewed and updated list of patient's medical providers Assessment of cognitive impairment was done Assessed patient's functional ability Established a written schedule for health screening services Health Risk Assessent Completed and Reviewed  Exercise Activities and Dietary recommendations  Goals      DIET - INCREASE WATER INTAKE  Recommend increasing water intake to 2-4 glasses a day (currently drinking none).      Quit Smoking     Recommend to continue efforts to reduce smoking habits until no longer smoking.         Immunization History  Administered Date(s) Administered   Fluad Quad(high Dose 65+) 07/02/2019, 07/20/2020, 06/28/2021, 06/21/2022   Influenza, High Dose Seasonal PF 06/26/2015, 06/15/2018   Influenza,inj,Quad PF,6+ Mos 07/24/2017   PFIZER(Purple Top)SARS-COV-2 Vaccination 10/20/2019, 12/11/2019, 01/01/2020, 09/10/2020, 11/29/2020   Pfizer Covid-19 Vaccine Bivalent Booster 78yrs & up 09/07/2021   Pneumococcal Conjugate-13 07/18/2014   Pneumococcal Polysaccharide-23 01/23/2012   Tdap 01/23/2012   Zoster Recombinat (Shingrix) 05/28/2020, 07/30/2020   Zoster, Live 05/18/2012     Health Maintenance  Topic Date Due   DTaP/Tdap/Td (2 - Td or Tdap) 01/22/2022   COVID-19 Vaccine (7 - 2023-24 season) 06/10/2022   COLONOSCOPY (Pts 45-52yrs Insurance coverage will need to be confirmed)  10/29/2022   Medicare Annual Wellness (AWV)  01/11/2023   Lung Cancer Screening  02/25/2023   INFLUENZA VACCINE  05/11/2023   Pneumonia Vaccine 63+ Years old  Completed   Hepatitis C Screening  Completed   Zoster Vaccines- Shingrix  Completed   HPV VACCINES  Aged Out     Discussed health benefits of physical activity, and encouraged him to engage in regular exercise appropriate for his age and condition.         Mila Merry, MD  [facility] [phone] (phone) [phone] (fax)  [facility]

## 2023-01-21 LAB — CBC WITH DIFFERENTIAL/PLATELET
Basophils Absolute: 0.1 10*3/uL (ref 0.0–0.2)
Basos: 1 %
EOS (ABSOLUTE): 0.2 10*3/uL (ref 0.0–0.4)
Eos: 3 %
Hematocrit: 42.8 % (ref 37.5–51.0)
Hemoglobin: 14.3 g/dL (ref 13.0–17.7)
Immature Grans (Abs): 0 10*3/uL (ref 0.0–0.1)
Immature Granulocytes: 1 %
Lymphocytes Absolute: 2 10*3/uL (ref 0.7–3.1)
Lymphs: 30 %
MCH: 31.8 pg (ref 26.6–33.0)
MCHC: 33.4 g/dL (ref 31.5–35.7)
MCV: 95 fL (ref 79–97)
Monocytes Absolute: 0.6 10*3/uL (ref 0.1–0.9)
Monocytes: 10 %
Neutrophils Absolute: 3.8 10*3/uL (ref 1.4–7.0)
Neutrophils: 55 %
Platelets: 180 10*3/uL (ref 150–450)
RBC: 4.5 x10E6/uL (ref 4.14–5.80)
RDW: 12.8 % (ref 11.6–15.4)
WBC: 6.7 10*3/uL (ref 3.4–10.8)

## 2023-01-21 LAB — COMPREHENSIVE METABOLIC PANEL
ALT: 17 IU/L (ref 0–44)
AST: 14 IU/L (ref 0–40)
Albumin/Globulin Ratio: 1.7 (ref 1.2–2.2)
Albumin: 4.1 g/dL (ref 3.8–4.8)
Alkaline Phosphatase: 63 IU/L (ref 44–121)
BUN/Creatinine Ratio: 12 (ref 10–24)
BUN: 15 mg/dL (ref 8–27)
Bilirubin Total: 0.3 mg/dL (ref 0.0–1.2)
CO2: 24 mmol/L (ref 20–29)
Calcium: 9.6 mg/dL (ref 8.6–10.2)
Chloride: 105 mmol/L (ref 96–106)
Creatinine, Ser: 1.24 mg/dL (ref 0.76–1.27)
Globulin, Total: 2.4 g/dL (ref 1.5–4.5)
Glucose: 82 mg/dL (ref 70–99)
Potassium: 4.5 mmol/L (ref 3.5–5.2)
Sodium: 142 mmol/L (ref 134–144)
Total Protein: 6.5 g/dL (ref 6.0–8.5)
eGFR: 60 mL/min/{1.73_m2} (ref >59)

## 2023-01-21 LAB — TSH: TSH: 2.11 u[IU]/mL (ref 0.450–4.500)

## 2023-01-21 LAB — LIPID PANEL WITH LDL/HDL RATIO
Cholesterol, Total: 142 mg/dL (ref 100–199)
HDL: 43 mg/dL (ref >39)
LDL Chol Calc (NIH): 68 mg/dL (ref 0–99)
LDL/HDL Ratio: 1.6 ratio (ref 0.0–3.6)
Triglycerides: 184 mg/dL — ABNORMAL HIGH (ref 0–149)
VLDL Cholesterol Cal: 31 mg/dL (ref 5–40)

## 2023-01-21 LAB — PSA TOTAL (REFLEX TO FREE): Prostate Specific Ag, Serum: 0.8 ng/mL (ref 0.0–4.0)

## 2023-02-27 ENCOUNTER — Ambulatory Visit
Admission: RE | Admit: 2023-02-27 | Discharge: 2023-02-27 | Disposition: A | Discharge status: Home / Self Care | Payer: Medicare PPO | Source: Ambulatory Visit | Attending: Acute Care | Admitting: Acute Care

## 2023-02-27 DIAGNOSIS — I700 Atherosclerosis of aorta: Secondary | ICD-10-CM | POA: Insufficient documentation

## 2023-02-27 DIAGNOSIS — F17210 Nicotine dependence, cigarettes, uncomplicated: Secondary | ICD-10-CM | POA: Diagnosis present

## 2023-02-27 DIAGNOSIS — J439 Emphysema, unspecified: Secondary | ICD-10-CM | POA: Diagnosis not present

## 2023-02-27 DIAGNOSIS — I2510 Atherosclerotic heart disease of native coronary artery without angina pectoris: Secondary | ICD-10-CM | POA: Insufficient documentation

## 2023-02-27 DIAGNOSIS — Z122 Encounter for screening for malignant neoplasm of respiratory organs: Secondary | ICD-10-CM

## 2023-02-27 DIAGNOSIS — Z87891 Personal history of nicotine dependence: Secondary | ICD-10-CM

## 2023-03-02 ENCOUNTER — Telehealth: Payer: Self-pay | Admitting: Acute Care

## 2023-03-02 NOTE — Telephone Encounter (Signed)
Received call report from [facility] with [facility] on patient's LSCT done on 02/27/23. Sarah, please review the result/impression copied below:  IMPRESSION: 1. Lung-RADS 4A, suspicious. Follow up low-dose chest CT without contrast in 3 months (please use the following order, "CT CHEST LCS NODULE FOLLOW-UP W/O CM") is recommended. Two new solid pulmonary nodules, largest 6.1 mm in volume derived mean diameter in the peripheral right upper lobe. 2. Two-vessel coronary atherosclerosis. 3. Aortic Atherosclerosis (ICD10-I70.0) and Emphysema (ICD10-J43.9).  Please advise, thank you.     **Also routing to lung nodule pool**

## 2023-03-02 NOTE — Telephone Encounter (Signed)
Called to give call report.  Please call back at [phone]

## 2023-03-03 ENCOUNTER — Telehealth: Payer: Self-pay | Admitting: Acute Care

## 2023-03-03 DIAGNOSIS — Z87891 Personal history of nicotine dependence: Secondary | ICD-10-CM

## 2023-03-03 DIAGNOSIS — F17210 Nicotine dependence, cigarettes, uncomplicated: Secondary | ICD-10-CM

## 2023-03-03 DIAGNOSIS — Z122 Encounter for screening for malignant neoplasm of respiratory organs: Secondary | ICD-10-CM

## 2023-03-03 NOTE — Telephone Encounter (Addendum)
I have attempted to call the patient with the results of their  Low Dose CT Chest Lung cancer screening scan. There was no answer. I have left a HIPPA compliant VM requesting the patient call the office for the scan results. I included the office contact information in the message. We will await his return call. If no return call we will continue to call until patient is contacted.    Ladies, this patient is fine for a 3 month follow up due 05/2023. I can talk with him when he calls back, or if you are comfortable talking with him that is also fine. Thanks so much

## 2023-03-07 NOTE — Telephone Encounter (Addendum)
Called and spoke with pt. Informed him of the results from his LDCT. Pt is agreeable to 3 month scan and understands the reason why the scan is needed. Order placed for f/u scan to be done in 05/2023. Questioned if pt would still like to speak with Maralyn Sago regarding results or if pt had any additional questions. PCP has been sent the results and notified of plan. Pt denied any further questions or concerns and denied needing a call from Sarah. Nothing further needed at this time.

## 2023-05-30 ENCOUNTER — Ambulatory Visit
Admission: RE | Admit: 2023-05-30 | Discharge: 2023-05-30 | Disposition: A | Discharge status: Home / Self Care | Payer: Medicare PPO | Source: Ambulatory Visit | Attending: Acute Care | Admitting: Acute Care

## 2023-05-30 DIAGNOSIS — Z87891 Personal history of nicotine dependence: Secondary | ICD-10-CM | POA: Diagnosis present

## 2023-05-30 DIAGNOSIS — F17210 Nicotine dependence, cigarettes, uncomplicated: Secondary | ICD-10-CM | POA: Diagnosis present

## 2023-05-30 DIAGNOSIS — J439 Emphysema, unspecified: Secondary | ICD-10-CM | POA: Diagnosis not present

## 2023-05-30 DIAGNOSIS — I700 Atherosclerosis of aorta: Secondary | ICD-10-CM | POA: Diagnosis not present

## 2023-05-30 DIAGNOSIS — Z122 Encounter for screening for malignant neoplasm of respiratory organs: Secondary | ICD-10-CM

## 2023-05-30 DIAGNOSIS — I2510 Atherosclerotic heart disease of native coronary artery without angina pectoris: Secondary | ICD-10-CM | POA: Diagnosis not present

## 2023-06-07 ENCOUNTER — Other Ambulatory Visit: Payer: Self-pay | Admitting: Acute Care

## 2023-06-07 DIAGNOSIS — Z87891 Personal history of nicotine dependence: Secondary | ICD-10-CM

## 2023-06-07 DIAGNOSIS — F17210 Nicotine dependence, cigarettes, uncomplicated: Secondary | ICD-10-CM

## 2023-06-07 DIAGNOSIS — Z122 Encounter for screening for malignant neoplasm of respiratory organs: Secondary | ICD-10-CM

## 2023-08-23 ENCOUNTER — Other Ambulatory Visit: Payer: Self-pay | Admitting: Family Medicine

## 2023-08-23 DIAGNOSIS — G4700 Insomnia, unspecified: Secondary | ICD-10-CM

## 2023-08-23 DIAGNOSIS — I10 Essential (primary) hypertension: Secondary | ICD-10-CM

## 2024-06-06 ENCOUNTER — Encounter: Payer: Self-pay | Admitting: Acute Care

## 2024-06-12 ENCOUNTER — Ambulatory Visit

## 2024-06-12 ENCOUNTER — Other Ambulatory Visit: Payer: Self-pay

## 2024-06-12 DIAGNOSIS — F17210 Nicotine dependence, cigarettes, uncomplicated: Secondary | ICD-10-CM

## 2024-06-12 DIAGNOSIS — Z0000 Encounter for general adult medical examination without abnormal findings: Secondary | ICD-10-CM

## 2024-06-12 DIAGNOSIS — Z122 Encounter for screening for malignant neoplasm of respiratory organs: Secondary | ICD-10-CM

## 2024-06-12 DIAGNOSIS — Z87891 Personal history of nicotine dependence: Secondary | ICD-10-CM

## 2024-06-12 NOTE — Patient Instructions (Signed)
 Mr. Bourbeau , Thank you for taking time out of your busy schedule to complete your Annual Wellness Visit with me. I enjoyed our conversation and look forward to speaking with you again next year. I, as well as your care team,  appreciate your ongoing commitment to your health goals. Please review the following plan we discussed and let me know if I can assist you in the future.  REFERRAL SENT FOR LUNG CA SCREENING  Follow up Visits: 06/18/25 @ 3:50 PM BY PHONE We will see or speak with you next year for your Next Medicare AWV with our clinical staff Have you seen your provider in the last 6 months (3 months if uncontrolled diabetes)? YES  Clinician Recommendations:  Aim for 30 minutes of exercise or brisk walking, 6-8 glasses of water, and 5 servings of fruits and vegetables each day. TAKE CARE!      This is a list of the screenings recommended for you:  Health Maintenance  Topic Date Due   DTaP/Tdap/Td vaccine (2 - Td or Tdap) 01/22/2022   Colon Cancer Screening  11/23/2022   Screening for Lung Cancer  08/30/2023   Flu Shot  05/10/2024   COVID-19 Vaccine (9 - 2024-25 season) 06/10/2024   Medicare Annual Wellness Visit  06/12/2025   Pneumococcal Vaccine for age over 46  Completed   Hepatitis C Screening  Completed   Zoster (Shingles) Vaccine  Completed   HPV Vaccine  Aged Out   Meningitis B Vaccine  Aged Out    Advanced directives: (Provided) Advance directive discussed with you today. I have provided a copy for you to complete at home and have notarized. Once this is complete, please bring a copy in to our office so we can scan it into your chart. - Mr. Ina, I will mail this to your address next week, thanks! Advance Care Planning is important because it:  [x]  Makes sure you receive the medical care that is consistent with your values, goals, and preferences  [x]  It provides guidance to your family and loved ones and reduces their decisional burden about whether or not they are making  the right decisions based on your wishes.  Follow the link provided in your after visit summary or read over the paperwork we have mailed to you to help you started getting your Advance Directives in place. If you need assistance in completing these, please reach out to us  so that we can help you!

## 2024-06-12 NOTE — Progress Notes (Signed)
 Subjective:   Brad Myers is a 78 y.o. who presents for a Medicare Wellness preventive visit.  As a reminder, Annual Wellness Visits don't include a physical exam, and some assessments may be limited, especially if this visit is performed virtually. We may recommend an in-person follow-up visit with your provider if needed.  Visit Complete: Virtual I connected with  Brad Myers on 06/12/24 by a audio enabled telemedicine application and verified that I am speaking with the correct person using two identifiers.  Patient Location: Home  Provider Location: Home Office  I discussed the limitations of evaluation and management by telemedicine. The patient expressed understanding and agreed to proceed.  Vital Signs: Because this visit was a virtual/telehealth visit, some criteria may be missing or patient reported. Any vitals not documented were not able to be obtained and vitals that have been documented are patient reported.  VideoDeclined- This patient declined Librarian, academic. Therefore the visit was completed with audio only.  Persons Participating in Visit: Patient.  AWV Questionnaire: No: Patient Medicare AWV questionnaire was not completed prior to this visit.  Cardiac Risk Factors include: advanced age (>81men, >51 women);dyslipidemia;hypertension;male gender;smoking/ tobacco exposure     Objective:    There were no vitals filed for this visit. There is no height or weight on file to calculate BMI.     06/12/2024    3:58 PM 06/07/2021    8:20 PM 03/11/2021    6:15 AM 03/09/2021   10:16 AM 02/10/2021   10:06 AM 01/04/2021   10:33 AM 12/30/2019   10:55 AM  Advanced Directives  Does Patient Have a Medical Advance Directive? Yes No Yes Yes No Yes Yes  Type of Estate agent of [place];Living will  Healthcare Power of [place];Living will Living will  Healthcare Power of [place];Living will Healthcare Power of [place];Living will   Does patient want to make changes to medical advance directive? No - Patient declined  No - Patient declined      Copy of Healthcare Power of Attorney in Chart? No - copy requested  No - copy requested   Yes - validated most recent copy scanned in chart (See row information) Yes - validated most recent copy scanned in chart (See row information)  Would patient like information on creating a medical advance directive?     No - Patient declined      Current Medications (verified) Outpatient Encounter Medications as of 06/12/2024  Medication Sig   atorvastatin  (LIPITOR) 40 MG tablet TAKE 1 TABLET(40 MG) BY MOUTH DAILY   losartan  (COZAAR ) 50 MG tablet Take 1 tablet (50 mg total) by mouth daily.   PROLENSA 0.07 % SOLN Apply 1 drop to eye 2 (two) times daily.   traZODone  (DESYREL ) 100 MG tablet Take 0.5-1 tablets (50-100 mg total) by mouth at bedtime.   Cholecalciferol (VITAMIN D3) 1000 units CAPS Take 1,000 Units by mouth every other day. (Patient not taking: Reported on 06/12/2024)   Potassium 99 MG TABS Take 99 mg by mouth every other day. (Patient not taking: Reported on 06/12/2024)   triamterene -hydrochlorothiazide  (MAXZIDE -25) 37.5-25 MG tablet TAKE 1 TABLET BY MOUTH DAILY FOR HYPERTENSION (Patient not taking: Reported on 06/12/2024)   No facility-administered encounter medications on file as of 06/12/2024.    Allergies (verified) Patient has no known allergies.   History: Past Medical History:  Diagnosis Date   Arthritis    Cecal polyp 09/19/2018   Tubular adenoma 07/2018 (Dr. Therisa). Unable to resect  at time of colonoscopy. Advanced polyp resection (Dr. Wilhelmenia) and surgical resction Carmin) offered. Patient elected to observe endoscopically.    Headache, migraine 06/24/2015   History of chicken pox 06/24/2015   DID have Chicken Pox. DID have Measles. DID have Mumps.     Hyperlipidemia    Hypertension    Stomach ulcer 06/24/2015   Past Surgical History:  Procedure Laterality Date    CARPOMETACARPAL (CMC) FUSION OF THUMB Left 03/11/2021   Procedure: ARTHROPLASTY, INTERPOSITION/INTERCARPAL/CMC JOINTS (SURG);  Surgeon: Cleotilde Barrio, MD;  Location: [facility] ORS;  Service: Orthopedics;  Laterality: Left;   COLONOSCOPY WITH PROPOFOL  N/A 07/11/2018   Procedure: COLONOSCOPY WITH PROPOFOL ;  Surgeon: Therisa Bi, MD;  Location: [facility] ENDOSCOPY;  Service: Gastroenterology;  Laterality: N/A;   EYE SURGERY Right 2003   HERNIA REPAIR Right 2005   Family History  Problem Relation Age of Onset   Lung cancer Mother        dx 24's, died 63. unk if 2 primary or met   Brain cancer Mother        dx 72's   Diabetes Father    Coronary artery disease Father    Colon cancer Neg Hx    Esophageal cancer Neg Hx    Inflammatory bowel disease Neg Hx    Liver disease Neg Hx    Pancreatic cancer Neg Hx    Rectal cancer Neg Hx    Stomach cancer Neg Hx    Social History   Socioeconomic History   Marital status: Divorced    Spouse name: Not on file   Number of children: 1   Years of education: Not on file   Highest education level: Some college, no degree  Occupational History   Occupation: retired  Tobacco Use   Smoking status: Every Day    Current packs/day: 0.50    Types: Cigarettes   Smokeless tobacco: Former    Types: Snuff, Chew   Tobacco comments:    Previously smoked >1ppd since teenager  Vaping Use   Vaping status: Never Used  Substance and Sexual Activity   Alcohol use: Yes    Alcohol/week: 18.0 standard drinks of alcohol    Types: 18 Cans of beer per week    Comment: drinks 3-4 days a week   Drug use: No   Sexual activity: Not on file  Other Topics Concern   Not on file  Social History Narrative   Not on file   Social Drivers of Health   Financial Resource Strain: Low Risk  (06/12/2024)   Overall Financial Resource Strain (CARDIA)    Difficulty of Paying Living Expenses: Not hard at all  Food Insecurity: No Food Insecurity (06/12/2024)   Hunger Vital Sign    Worried  About Running Out of Food in the Last Year: Never true    Ran Out of Food in the Last Year: Never true  Transportation Needs: No Transportation Needs (06/12/2024)   PRAPARE - Administrator, Civil Service (Medical): No    Lack of Transportation (Non-Medical): No  Physical Activity: Insufficiently Active (06/12/2024)   Exercise Vital Sign    Days of Exercise per Week: 4 days    Minutes of Exercise per Session: 30 min  Stress: No Stress Concern Present (06/12/2024)   [employer] of Occupational Health - Occupational Stress Questionnaire    Feeling of Stress: Not at all  Social Connections: Moderately Isolated (06/12/2024)   Social Connection and Isolation Panel    Frequency of Communication with  Friends and Family: More than three times a week    Frequency of Social Gatherings with Friends and Family: More than three times a week    Attends Religious Services: Never    Database administrator or Organizations: Yes    Attends Banker Meetings: Never    Marital Status: Divorced    Tobacco Counseling Ready to quit: Not Answered Counseling given: Not Answered Tobacco comments: Previously smoked >1ppd since teenager    Clinical Intake:  Pre-visit preparation completed: Yes  Pain : No/denies pain     Nutritional Risks: None Diabetes: No  Lab Results  Component Value Date   HGBA1C 5.1 04/21/2017     How often do you need to have someone help you when you read instructions, pamphlets, or other written materials from your doctor or pharmacy?: 1 - Never  Interpreter Needed?: No  Information entered by :: JHONNIE DAS, LPN   Activities of Daily Living     06/12/2024    4:05 PM  In your present state of health, do you have any difficulty performing the following activities:  Hearing? 0  Vision? 1  Difficulty concentrating or making decisions? 0  Walking or climbing stairs? 0  Dressing or bathing? 0  Doing errands, shopping? 0  Preparing Food and  eating ? N  Using the Toilet? N  In the past six months, have you accidently leaked urine? N  Do you have problems with loss of bowel control? N  Managing your Medications? N  Managing your Finances? N  Housekeeping or managing your Housekeeping? N    Patient Care Team: Gasper Nancyann BRAVO, MD as PCP - General (Family Medicine) Dingeldein, Elspeth, MD (Ophthalmology) System, Provider Not In (Dermatology) Portia Fireman, OD (Optometry)  I have updated your Care Teams any recent Medical Services you may have received from other providers in the past year.     Assessment:   This is a routine wellness examination for Lynette.  Hearing/Vision screen Hearing Screening - Comments:: NO AIDS Vision Screening - Comments:: [facility]-[email or web address]- SEEN EVERY OTHER MONTH, GOING TO HAVE SURGERY- HAS STRABISMUS   Goals Addressed             This Visit's Progress    DIET - EAT MORE FRUITS AND VEGETABLES         Depression Screen     06/12/2024    3:57 PM 01/20/2023    9:39 AM 01/10/2022    9:13 AM 01/04/2021   10:31 AM 07/20/2020    1:25 PM 12/30/2019   10:51 AM 12/26/2018    8:56 AM  PHQ 2/9 Scores  PHQ - 2 Score 0 0 0 0 0 0 0  PHQ- 9 Score 0 0 0        Fall Risk     06/12/2024    4:04 PM 01/20/2023    9:39 AM 01/10/2022    9:13 AM 01/04/2021   10:33 AM 07/20/2020    1:25 PM  Fall Risk   Falls in the past year? 0 0 0 0 0  Number falls in past yr: 0 0 0 0 0  Injury with Fall? 0 0 0 0 0  Risk for fall due to : No Fall Risks  No Fall Risks    Follow up Falls evaluation completed;Falls prevention discussed  Falls evaluation completed        Data saved with a previous flowsheet row definition    MEDICARE RISK AT HOME:  Medicare Risk at Home  Any stairs in or around the home?: Yes If so, are there any without handrails?: No Home free of loose throw rugs in walkways, pet beds, electrical cords, etc?: Yes Adequate lighting in your home to reduce risk of falls?: Yes Life alert?:  No Use of a cane, walker or w/c?: No Grab bars in the bathroom?: No Shower chair or bench in shower?: No Elevated toilet seat or a handicapped toilet?: No  TIMED UP AND GO:  Was the test performed?  No  Cognitive Function: 6CIT completed        06/12/2024    4:06 PM 11/29/2016    1:21 PM  6CIT Screen  What Year? 0 points 0 points  What month? 0 points 0 points  What time? 0 points 0 points  Count back from 20 0 points 0 points  Months in reverse 0 points 0 points  Repeat phrase 0 points 0 points  Total Score 0 points 0 points    Immunizations Immunization History  Administered Date(s) Administered   Fluad Quad(high Dose 65+) 07/02/2019, 07/20/2020, 06/28/2021, 06/21/2022   INFLUENZA, HIGH DOSE SEASONAL PF 06/26/2015, 06/15/2018, 06/26/2023   Influenza,inj,Quad PF,6+ Mos 07/24/2017   PFIZER(Purple Top)SARS-COV-2 Vaccination 10/20/2019, 12/11/2019, 01/01/2020, 09/10/2020, 11/29/2020   Pfizer Covid-19 Vaccine Bivalent Booster 71yrs & up 09/07/2021   Pfizer(Comirnaty)Fall Seasonal Vaccine 12 years and older 07/15/2022, 07/11/2023   Pneumococcal Conjugate-13 07/18/2014   Pneumococcal Polysaccharide-23 01/23/2012   Tdap 01/23/2012   Zoster Recombinant(Shingrix) 05/28/2020, 07/30/2020   Zoster, Live 05/18/2012    Screening Tests Health Maintenance  Topic Date Due   DTaP/Tdap/Td (2 - Td or Tdap) 01/22/2022   Colonoscopy  11/23/2022   Lung Cancer Screening  08/30/2023   INFLUENZA VACCINE  05/10/2024   COVID-19 Vaccine (9 - 2024-25 season) 06/10/2024   Medicare Annual Wellness (AWV)  06/12/2025   Pneumococcal Vaccine: 50+ Years  Completed   Hepatitis C Screening  Completed   Zoster Vaccines- Shingrix  Completed   HPV VACCINES  Aged Out   Meningococcal B Vaccine  Aged Out    Health Maintenance  Health Maintenance Due  Topic Date Due   DTaP/Tdap/Td (2 - Td or Tdap) 01/22/2022   Colonoscopy  11/23/2022   Lung Cancer Screening  08/30/2023   INFLUENZA VACCINE   05/10/2024   COVID-19 Vaccine (9 - 2024-25 season) 06/10/2024   Health Maintenance Items Addressed: AGED OUT OF COLONOSCOPY; NEED PNA & TDAP & COVID; LUNG CA SCREEN ORDERED  Additional Screening:  Vision Screening: Recommended annual ophthalmology exams for early detection of glaucoma and other disorders of the eye. Would you like a referral to an eye doctor? No    Dental Screening: Recommended annual dental exams for proper oral hygiene  Community Resource Referral / Chronic Care Management: CRR required this visit?  No   CCM required this visit?  No   Plan:    I have personally reviewed and noted the following in the patient's chart:   Medical and social history Use of alcohol, tobacco or illicit drugs  Current medications and supplements including opioid prescriptions. Patient is not currently taking opioid prescriptions. Functional ability and status Nutritional status Physical activity Advanced directives List of other physicians Hospitalizations, surgeries, and ER visits in previous 12 months Vitals Screenings to include cognitive, depression, and falls Referrals and appointments  In addition, I have reviewed and discussed with patient certain preventive protocols, quality metrics, and best practice recommendations. A written personalized care plan for preventive services as well as general preventive health recommendations  were provided to patient.   Jhonnie GORMAN Das, LPN   [id number]   After Visit Summary: (MyChart) Due to this being a telephonic visit, the after visit summary with patients personalized plan was offered to patient via MyChart   Notes: LUNG CA SCREEN ORDERED

## 2024-06-21 ENCOUNTER — Ambulatory Visit
Admission: RE | Admit: 2024-06-21 | Discharge: 2024-06-21 | Disposition: A | Discharge status: Home / Self Care | Source: Ambulatory Visit | Attending: Acute Care | Admitting: Acute Care

## 2024-06-21 DIAGNOSIS — Z122 Encounter for screening for malignant neoplasm of respiratory organs: Secondary | ICD-10-CM | POA: Diagnosis not present

## 2024-06-21 DIAGNOSIS — F17210 Nicotine dependence, cigarettes, uncomplicated: Secondary | ICD-10-CM | POA: Insufficient documentation

## 2024-06-21 DIAGNOSIS — Z87891 Personal history of nicotine dependence: Secondary | ICD-10-CM | POA: Diagnosis not present

## 2024-07-03 ENCOUNTER — Telehealth: Payer: Self-pay | Admitting: Acute Care

## 2024-07-03 NOTE — Telephone Encounter (Signed)
 Called patient to relay results. Called twice and rang busy each time.    IMPRESSION: 1. Lung-RADS 2, benign appearance or behavior. Continue annual screening with low-dose chest CT without contrast in 12 months. 2. Mild basilar subpleural reticulation coarsened ground-glass raise suspicion for interstitial lung disease. If further evaluation is desired, high-resolution chest CT without contrast is recommended. 3. Aortic atherosclerosis (ICD10-I70.0). Coronary artery calcification. 4.  Emphysema (ICD10-J43.9).     Electronically Signed   By: Newell Eke M.D.   On: 07/03/2024 08:10

## 2024-07-04 NOTE — Telephone Encounter (Signed)
 LVM to call office and speak with staff to review CT results.

## 2024-07-08 NOTE — Telephone Encounter (Signed)
 Called and spoke with the patient. Reviewed recent lung CT results. He is aware that this was his last LDCT in the [facility] program as he will turn 53 this year. He declined an appt with one of our ILD's specialist. Advises he will discuss the results with his PCP Dr. Gasper. No further needs.   Dr. Gasper, If Mr. Edgecombe changes his mind we will be happy to get him scheduled with one of our ILD specialists. Have a good day.   Isaiah, RN

## 2024-07-30 DIAGNOSIS — D2272 Melanocytic nevi of left lower limb, including hip: Secondary | ICD-10-CM | POA: Diagnosis not present

## 2024-07-30 DIAGNOSIS — L570 Actinic keratosis: Secondary | ICD-10-CM | POA: Diagnosis not present

## 2024-07-30 DIAGNOSIS — D225 Melanocytic nevi of trunk: Secondary | ICD-10-CM | POA: Diagnosis not present

## 2024-07-30 DIAGNOSIS — Z85828 Personal history of other malignant neoplasm of skin: Secondary | ICD-10-CM | POA: Diagnosis not present

## 2024-07-30 DIAGNOSIS — D2261 Melanocytic nevi of right upper limb, including shoulder: Secondary | ICD-10-CM | POA: Diagnosis not present

## 2024-07-30 DIAGNOSIS — L820 Inflamed seborrheic keratosis: Secondary | ICD-10-CM | POA: Diagnosis not present

## 2024-07-30 DIAGNOSIS — L538 Other specified erythematous conditions: Secondary | ICD-10-CM | POA: Diagnosis not present

## 2024-07-30 DIAGNOSIS — D2262 Melanocytic nevi of left upper limb, including shoulder: Secondary | ICD-10-CM | POA: Diagnosis not present

## 2024-08-09 ENCOUNTER — Telehealth: Payer: Self-pay

## 2024-08-09 NOTE — Telephone Encounter (Signed)
 Patient is overdue for an appointment. LMOM for patient to call and schedule.

## 2024-08-14 ENCOUNTER — Encounter: Payer: Self-pay | Admitting: Family Medicine

## 2024-08-14 ENCOUNTER — Ambulatory Visit (INDEPENDENT_AMBULATORY_CARE_PROVIDER_SITE_OTHER): Admitting: Family Medicine

## 2024-08-14 VITALS — BP 131/60 | HR 76 | Ht 65.0 in | Wt 132.2 lb

## 2024-08-14 DIAGNOSIS — E785 Hyperlipidemia, unspecified: Secondary | ICD-10-CM | POA: Diagnosis not present

## 2024-08-14 DIAGNOSIS — Z0000 Encounter for general adult medical examination without abnormal findings: Secondary | ICD-10-CM

## 2024-08-14 DIAGNOSIS — I700 Atherosclerosis of aorta: Secondary | ICD-10-CM

## 2024-08-14 DIAGNOSIS — I10 Essential (primary) hypertension: Secondary | ICD-10-CM | POA: Diagnosis not present

## 2024-08-14 DIAGNOSIS — F17210 Nicotine dependence, cigarettes, uncomplicated: Secondary | ICD-10-CM | POA: Diagnosis not present

## 2024-08-14 DIAGNOSIS — J439 Emphysema, unspecified: Secondary | ICD-10-CM

## 2024-08-14 NOTE — Patient Instructions (Addendum)
Please review the attached list of medications and notify my office if there are any errors.   You are due for a Tdap (tetanus-diptheria-pertussis vaccine) which protects you from tetanus and whooping cough. Please check with your insurance plan or pharmacy regarding coverage for this vaccine.   

## 2024-08-14 NOTE — Progress Notes (Signed)
 Complete physical exam   Patient: Brad Myers   DOB: 23-Apr-1946   78 y.o. Male  MRN: [id number] Visit Date: 08/14/2024  Today's healthcare provider: Nancyann Perry, MD   Chief Complaint  Patient presents with   Annual Exam    Last completed 01/20/23 Diet -  Well balanced Exercise - walking 3 times a week for 20-30 minutes depending on the weather Feeling - well Sleeping - well Concerns -  none   Subjective    Discussed the use of AI scribe software for clinical note transcription with the patient, who gave verbal consent to proceed.  History of Present Illness   Brad Myers is a 78 year old male with hypertension, hyperlipidemia, and emphysema who presents for routine physical.   He continues to follow up at [facility] in [facility] for management of hypertension and hyperlipidemia. His blood pressure readings at home vary, with some readings as high as 170-180 mmHg, although it is often around 130-140 mmHg. He is currently taking losartan , which was recently increased, but he is unsure of the exact dosage. He no longer takes triamterene  or potassium supplements.  He has a history of hyperlipidemia and is currently taking atorvastatin  for cholesterol management. He occasionally takes a vitamin D3 supplement.  He has emphysema per LDCT and continues to smoke, consuming a pack of cigarettes every two to three days. A CT scan of his lungs in September showed nodules, which he reports have been described similarly in previous years. No current breathing problems are reported.  He experiences double vision, which has persisted for over four years. He has seen multiple optometrists and ophthalmologists without resolution, and he believes surgery might be necessary. He has tried multiple pairs of glasses without improvement.  He has not had a colonoscopy in over three years, and the [facility] has indicated that he does not need further colonoscopies.      HPI     Annual Exam    Additional  comments: Last completed 01/20/23 Diet -  Well balanced Exercise - walking 3 times a week for 20-30 minutes depending on the weather Feeling - well Sleeping - well Concerns -  none      Last edited by Lilian Fitzpatrick, CMA on 08/14/2024  1:06 PM.       Past Medical History:  Diagnosis Date   Arthritis    Cecal polyp 09/19/2018   Tubular adenoma 07/2018 (Dr. Therisa). Unable to resect at time of colonoscopy. Advanced polyp resection (Dr. Wilhelmenia) and surgical resction Carmin) offered. Patient elected to observe endoscopically.    Headache, migraine 06/24/2015   History of chicken pox 06/24/2015   DID have Chicken Pox. DID have Measles. DID have Mumps.     Hyperlipidemia    Hypertension    Stomach ulcer 06/24/2015   Past Surgical History:  Procedure Laterality Date   CARPOMETACARPAL (CMC) FUSION OF THUMB Left 03/11/2021   Procedure: ARTHROPLASTY, INTERPOSITION/INTERCARPAL/CMC JOINTS (SURG);  Surgeon: Cleotilde Barrio, MD;  Location: [facility] ORS;  Service: Orthopedics;  Laterality: Left;   COLONOSCOPY WITH PROPOFOL  N/A 07/11/2018   Procedure: COLONOSCOPY WITH PROPOFOL ;  Surgeon: Therisa Bi, MD;  Location: [facility];  Service: Gastroenterology;  Laterality: N/A;   EYE SURGERY Right 2003   HERNIA REPAIR Right 2005   Social History   Socioeconomic History   Marital status: Divorced    Spouse name: Not on file   Number of children: 1   Years of education: Not on file   Highest education  level: Some college, no degree  Occupational History   Occupation: retired  Tobacco Use   Smoking status: Every Day    Current packs/day: 0.50    Types: Cigarettes   Smokeless tobacco: Former    Types: Snuff, Chew   Tobacco comments:    Previously smoked >1ppd since teenager  Vaping Use   Vaping status: Never Used  Substance and Sexual Activity   Alcohol use: Yes    Alcohol/week: 18.0 standard drinks of alcohol    Types: 18 Cans of beer per week    Comment: drinks 3-4 days a week   Drug  use: No   Sexual activity: Not on file  Other Topics Concern   Not on file  Social History Narrative   Not on file   Social Drivers of Health   Financial Resource Strain: Low Risk  (06/12/2024)   Overall Financial Resource Strain (CARDIA)    Difficulty of Paying Living Expenses: Not hard at all  Food Insecurity: No Food Insecurity (06/12/2024)   Hunger Vital Sign    Worried About Running Out of Food in the Last Year: Never true    Ran Out of Food in the Last Year: Never true  Transportation Needs: No Transportation Needs (06/12/2024)   PRAPARE - Administrator, Civil Service (Medical): No    Lack of Transportation (Non-Medical): No  Physical Activity: Insufficiently Active (06/12/2024)   Exercise Vital Sign    Days of Exercise per Week: 4 days    Minutes of Exercise per Session: 30 min  Stress: No Stress Concern Present (06/12/2024)   [employer] of Occupational Health - Occupational Stress Questionnaire    Feeling of Stress: Not at all  Social Connections: Moderately Isolated (06/12/2024)   Social Connection and Isolation Panel    Frequency of Communication with Friends and Family: More than three times a week    Frequency of Social Gatherings with Friends and Family: More than three times a week    Attends Religious Services: Never    Database Administrator or Organizations: Yes    Attends Banker Meetings: Never    Marital Status: Divorced  Catering Manager Violence: Not At Risk (06/12/2024)   Humiliation, Afraid, Rape, and Kick questionnaire    Fear of Current or Ex-Partner: No    Emotionally Abused: No    Physically Abused: No    Sexually Abused: No   Family Status  Relation Name Status   Mother  Deceased at age 84       died from lung cancer   Father  Deceased at age 15       died from an MI   Brother  Deceased at age 24       died from an MI   Daughter  Alive   Neg Hx  (Not Specified)  No partnership data on file   Family History  Problem  Relation Age of Onset   Lung cancer Mother        dx 77's, died 31. unk if 2 primary or met   Brain cancer Mother        dx 32's   Diabetes Father    Coronary artery disease Father    Colon cancer Neg Hx    Esophageal cancer Neg Hx    Inflammatory bowel disease Neg Hx    Liver disease Neg Hx    Pancreatic cancer Neg Hx    Rectal cancer Neg Hx    Stomach cancer  Neg Hx    No Known Allergies  Patient Care Team: Gasper Nancyann BRAVO, MD as PCP - General (Family Medicine) Dingeldein, Elspeth, MD (Ophthalmology) System, Provider Not In (Dermatology) Portia Fireman, OD (Optometry)   Medications: Outpatient Medications Prior to Visit  Medication Sig   atorvastatin  (LIPITOR) 40 MG tablet TAKE 1 TABLET(40 MG) BY MOUTH DAILY   Cholecalciferol (VITAMIN D3) 1000 units CAPS Take 1,000 Units by mouth every other day.   losartan  (COZAAR ) 50 MG tablet Take 1 tablet (50 mg total) by mouth daily.   traZODone  (DESYREL ) 100 MG tablet Take 0.5-1 tablets (50-100 mg total) by mouth at bedtime.   Potassium 99 MG TABS Take 99 mg by mouth every other day. (Patient not taking: Reported on 08/14/2024)   PROLENSA 0.07 % SOLN Apply 1 drop to eye 2 (two) times daily. (Patient not taking: Reported on 08/14/2024)   triamterene -hydrochlorothiazide  (MAXZIDE -25) 37.5-25 MG tablet TAKE 1 TABLET BY MOUTH DAILY FOR HYPERTENSION (Patient not taking: Reported on 08/14/2024)   No facility-administered medications prior to visit.    Review of Systems  Constitutional:  Negative for appetite change, chills and fever.  Respiratory:  Negative for chest tightness, shortness of breath and wheezing.   Cardiovascular:  Negative for chest pain and palpitations.  Gastrointestinal:  Negative for abdominal pain, nausea and vomiting.      Objective    BP 131/60 (BP Location: Left Arm, Patient Position: Sitting, Cuff Size: Normal)   Pulse 76   Ht 5' 5 (1.651 m)   Wt 132 lb 3.2 oz (60 kg)   SpO2 99%   BMI 22.00 kg/m     Physical Exam  General Appearance:    Well developed, well nourished male. Alert, cooperative, in no acute distress, appears stated age  Head:    Normocephalic, without obvious abnormality, atraumatic  Eyes:    PERRL, conjunctiva/corneas clear, EOM's intact, fundi    benign, both eyes       Ears:    Normal TM's and external ear canals, both ears  Nose:   Nares normal, septum midline, mucosa normal, no drainage   or sinus tenderness  Throat:   Lips, mucosa, and tongue normal; teeth and gums normal  Neck:   Supple, symmetrical, trachea midline, no adenopathy;       thyroid :  No enlargement/tenderness/nodules; no carotid   bruit or JVD  Back:     Symmetric, no curvature, ROM normal, no CVA tenderness  Lungs:     Clear to auscultation bilaterally, respirations unlabored  Chest wall:    No tenderness or deformity  Heart:    Normal heart rate. Normal rhythm. No murmurs, rubs, or gallops.  S1 and S2 normal  Abdomen:     Soft, non-tender, bowel sounds active all four quadrants,    no masses, no organomegaly  Genitalia:    deferred  Rectal:    deferred  Extremities:   All extremities are intact. No cyanosis or edema  Pulses:   2+ and symmetric all extremities  Skin:   Skin color, texture, turgor normal, no rashes or lesions  Lymph nodes:   Cervical, supraclavicular, and axillary nodes normal  Neurologic:   CNII-XII intact. Normal strength, sensation and reflexes      throughout       Last depression screening scores    06/12/2024    3:57 PM 01/20/2023    9:39 AM 01/10/2022    9:13 AM  PHQ 2/9 Scores  PHQ - 2 Score 0 0 0  PHQ- 9 Score 0 0 0   Last fall risk screening    06/12/2024    4:04 PM  Fall Risk   Falls in the past year? 0  Number falls in past yr: 0  Injury with Fall? 0  Risk for fall due to : No Fall Risks  Follow up Falls evaluation completed;Falls prevention discussed   Last Audit-C alcohol use screening    06/12/2024    3:56 PM  Alcohol Use Disorder Test (AUDIT)   1. How often do you have a drink containing alcohol? 3  2. How many drinks containing alcohol do you have on a typical day when you are drinking? 0  3. How often do you have six or more drinks on one occasion? 0  AUDIT-C Score 3  4. How often during the last year have you found that you were not able to stop drinking once you had started? 0  5. How often during the last year have you failed to do what was normally expected from you because of drinking? 0  6. How often during the last year have you needed a first drink in the morning to get yourself going after a heavy drinking session? 0  7. How often during the last year have you had a feeling of guilt of remorse after drinking? 0  8. How often during the last year have you been unable to remember what happened the night before because you had been drinking? 0  9. Have you or someone else been injured as a result of your drinking? 0  10. Has a relative or friend or a doctor or another health worker been concerned about your drinking or suggested you cut down? 0  Alcohol Use Disorder Identification Test Final Score (AUDIT) 3   A score of 3 or more in women, and 4 or more in men indicates increased risk for alcohol abuse, EXCEPT if all of the points are from question 1   No results found for any visits on 08/14/24.  Assessment & Plan    Routine Health Maintenance and Physical Exam  Exercise Activities and Dietary recommendations  Goals      DIET - EAT MORE FRUITS AND VEGETABLES     DIET - INCREASE WATER INTAKE     Recommend increasing water intake to 2-4 glasses a day (currently drinking none).      Quit Smoking     Recommend to continue efforts to reduce smoking habits until no longer smoking.         Immunization History  Administered Date(s) Administered   Fluad Quad(high Dose 65+) 07/02/2019, 07/20/2020, 06/28/2021, 06/21/2022   INFLUENZA, HIGH DOSE SEASONAL PF 06/26/2015, 06/15/2018, 06/26/2023, 06/28/2024    Influenza,inj,Quad PF,6+ Mos 07/24/2017   Moderna Covid-19 Fall Seasonal Vaccine 52yrs & older 07/29/2024   PFIZER(Purple Top)SARS-COV-2 Vaccination 10/20/2019, 12/11/2019, 01/01/2020, 09/10/2020, 11/29/2020   Pfizer Covid-19 Vaccine Bivalent Booster 56yrs & up 09/07/2021   Pfizer(Comirnaty)Fall Seasonal Vaccine 12 years and older 07/15/2022, 07/11/2023   Pneumococcal Conjugate-13 07/18/2014   Pneumococcal Polysaccharide-23 01/23/2012   Tdap 01/23/2012   Zoster Recombinant(Shingrix) 05/28/2020, 07/30/2020   Zoster, Live 05/18/2012    Health Maintenance  Topic Date Due   DTaP/Tdap/Td (2 - Td or Tdap) 01/22/2022   COVID-19 Vaccine (10 - 2025-26 season) 01/27/2025   Medicare Annual Wellness (AWV)  06/12/2025   Lung Cancer Screening  06/21/2025   Pneumococcal Vaccine: 50+ Years  Completed   Influenza Vaccine  Completed   Hepatitis C Screening  Completed   Zoster Vaccines- Shingrix  Completed   Meningococcal B Vaccine  Aged Out   Colonoscopy  Discontinued    Discussed health benefits of physical activity, and encouraged him to engage in regular exercise appropriate for his age and condition.   2. Essential (primary) hypertension BP near goal. [facility].   3. Hyperlipidemia, unspecified hyperlipidemia type He is tolerating atorvastatin  well with no adverse effects.  Labs done through [facility].   4. Smoking greater than 30 pack years Encourage smoking cessation. UTD on LDCT screening.   5. Aortic atherosclerosis Asymptomatic. Compliant with medication.  Continue aggressive risk factor modification.    6. Pulmonary emphysema (HCC) Encourage smoking cessation.      Return in about 1 year (around 08/14/2025).        Nancyann Perry, MD  [facility] [phone] (phone) [phone] (fax)  [facility]

## 2025-06-18 ENCOUNTER — Ambulatory Visit

## 2025-08-15 ENCOUNTER — Encounter: Admitting: Family Medicine
